# Patient Record
Sex: Female | Born: 1935 | Race: Black or African American | Hispanic: No | Smoking: Never smoker
Health system: Southern US, Community
[De-identification: ages and names within clinical notes are randomized; demographics above are authoritative.]

## PROBLEM LIST (undated history)

## (undated) DIAGNOSIS — W19XXXA Unspecified fall, initial encounter: Secondary | ICD-10-CM

## (undated) DIAGNOSIS — G309 Alzheimer's disease, unspecified: Secondary | ICD-10-CM

## (undated) DIAGNOSIS — N189 Chronic kidney disease, unspecified: Secondary | ICD-10-CM

## (undated) DIAGNOSIS — F0390 Unspecified dementia without behavioral disturbance: Secondary | ICD-10-CM

## (undated) DIAGNOSIS — F0280 Dementia in other diseases classified elsewhere without behavioral disturbance: Secondary | ICD-10-CM

## (undated) HISTORY — PX: ABDOMINAL HYSTERECTOMY: SHX81

---

## 2012-07-18 ENCOUNTER — Ambulatory Visit (INDEPENDENT_AMBULATORY_CARE_PROVIDER_SITE_OTHER): Payer: Medicare Other | Admitting: Family Medicine

## 2012-07-18 VITALS — BP 160/68 | HR 74 | Temp 98.3°F | Resp 16 | Ht 67.75 in | Wt 112.0 lb

## 2012-07-18 DIAGNOSIS — IMO0001 Reserved for inherently not codable concepts without codable children: Secondary | ICD-10-CM

## 2012-07-18 DIAGNOSIS — R609 Edema, unspecified: Secondary | ICD-10-CM

## 2012-07-18 DIAGNOSIS — L0291 Cutaneous abscess, unspecified: Secondary | ICD-10-CM

## 2012-07-18 DIAGNOSIS — L0390 Cellulitis, unspecified: Secondary | ICD-10-CM

## 2012-07-18 LAB — POCT CBC
HCT, POC: 39.2 % (ref 37.7–47.9)
Hemoglobin: 12.4 g/dL (ref 12.2–16.2)
Lymph, poc: 1.9 (ref 0.6–3.4)
MCH, POC: 27 pg (ref 27–31.2)
MPV: 8.1 fL (ref 0–99.8)
POC Granulocyte: 7.6 — AB (ref 2–6.9)
POC MID %: 7.3 %M (ref 0–12)
RBC: 4.6 M/uL (ref 4.04–5.48)
WBC: 10.2 10*3/uL (ref 4.6–10.2)

## 2012-07-18 LAB — GLUCOSE, POCT (MANUAL RESULT ENTRY): POC Glucose: 87 mg/dl (ref 70–99)

## 2012-07-18 MED ORDER — CEPHALEXIN 500 MG PO CAPS: 500.0000 mg | ORAL_CAPSULE | Freq: Four times a day (QID) | ORAL | Status: DC

## 2012-07-18 NOTE — Progress Notes (Signed)
Subjective:    Patient ID: Shelley Fowler, female    DOB: [birth year], 79 y.o.   MRN: [id number]   Chief Complaint  Patient presents with  . Leg Swelling    left lower and foot    HPI  Normally seen at [facility] - last seen in 02/2012.  Last labs were 02/2011 - nml glucose.    Shelley Fowler was in her normal state of health until yesterday morning when her family noted that her left lower leg looked more swollen than usual. In the afternoon, the swelling looked a little more prominent and there were red spots on the back of her lower leg above her ankle.  Today the swelling seems a little better but the whole lower leg turned hot and red.  No cough, SHoB, CP, palpitations.  2d ago got very DOE when walking down the driveway which is new.  Baseline dementia - cared for by family. Here with daughter and granddaughter.  History reviewed. No pertinent past medical history. No current outpatient prescriptions on file prior to visit.   No current facility-administered medications on file prior to visit.   No Known Allergies   Review of Systems  Constitutional: Positive for activity change. Negative for fever, chills and diaphoresis.  Respiratory: Positive for shortness of breath. Negative for cough, chest tightness and wheezing.   Cardiovascular: Positive for leg swelling. Negative for chest pain and palpitations.  Musculoskeletal: Positive for myalgias and joint swelling. Negative for gait problem.  Skin: Positive for color change and rash. Negative for pallor and wound.  Hematological: Negative for adenopathy.  Psychiatric/Behavioral: Positive for confusion.      BP 160/68  Pulse 74  Temp(Src) 98.3 F (36.8 C) (Oral)  Resp 16  Ht 5' 7.75" (1.721 m)  Wt 112 lb (50.803 kg)  BMI 17.15 kg/m2  SpO2 97% Objective:   Physical Exam  Constitutional: She is oriented to person, place, and time. She appears well-developed and well-nourished. No distress.  HENT:  Head:  Normocephalic and atraumatic.  Right Ear: External ear normal.  Eyes: Conjunctivae are normal. No scleral icterus.  Pulmonary/Chest: Effort normal.  Neurological: She is alert and oriented to person, place, and time.  Skin: Skin is warm and dry. Rash noted. Rash is macular. She is not diaphoretic. No erythema.     Red non-blanching warm continuous macule over left lower leg above ankle to mid-calf, mildly tender, 2+ pitting edema. Neg homan's sign. No calf tenderness, no palpable cords.  Psychiatric: She has a normal mood and affect. Her behavior is normal.      Results for orders placed in visit on 07/18/12  POCT CBC      Result Value Range   WBC 10.2  4.6 - 10.2 K/uL   Lymph, poc 1.9  0.6 - 3.4   POC LYMPH PERCENT 18.2  10 - 50 %L   MID (cbc) 0.7  0 - 0.9   POC MID % 7.3  0 - 12 %M   POC Granulocyte 7.6 (*) 2 - 6.9   Granulocyte percent 74.5  37 - 80 %G   RBC 4.60  4.04 - 5.48 M/uL   Hemoglobin 12.4  12.2 - 16.2 g/dL   HCT, POC 40.9  81.1 - 47.9 %   MCV 85.2  80 - 97 fL   MCH, POC 27.0  27 - 31.2 pg   MCHC 31.6 (*) 31.8 - 35.4 g/dL   RDW, POC 91.4     Platelet Count,  POC 283  142 - 424 K/uL   MPV 8.1  0 - 99.8 fL  GLUCOSE, POCT (MANUAL RESULT ENTRY)      Result Value Range   POC Glucose 87  70 - 99 mg/dl    Assessment & Plan:  Edema - Plan: POCT CBC  Cellulitis - Plan: POCT glucose (manual entry) - outlined w/ sure script marker. RTC immed if worsening. If swelling continues or progresses, RTC immed as may need lower ext [place],  Myalgia and myositis  - Plan: POCT glucose (manual entry)  Meds ordered this encounter  Medications  . cephALEXin (KEFLEX) 500 MG capsule    Sig: Take 1 capsule (500 mg total) by mouth 4 (four) times daily.    Dispense:  28 capsule    Refill:  0

## 2012-07-18 NOTE — Patient Instructions (Addendum)
Cellulitis Cellulitis is an infection of the skin and the tissue beneath it. The infected area is usually red and tender. Cellulitis occurs most often in the arms and lower legs.  CAUSES  Cellulitis is caused by bacteria that enter the skin through cracks or cuts in the skin. The most common types of bacteria that cause cellulitis are Staphylococcus and Streptococcus. SYMPTOMS   Redness and warmth.  Swelling.  Tenderness or pain.  Fever. DIAGNOSIS  Your caregiver can usually determine what is wrong based on a physical exam. Blood tests may also be done. TREATMENT  Treatment usually involves taking an antibiotic medicine. HOME CARE INSTRUCTIONS   Take your antibiotics as directed. Finish them even if you start to feel better.  Keep the infected arm or leg elevated to reduce swelling.  Apply a warm cloth to the affected area up to 4 times per day to relieve pain.  Only take over-the-counter or prescription medicines for pain, discomfort, or fever as directed by your caregiver.  Keep all follow-up appointments as directed by your caregiver. SEEK MEDICAL CARE IF:   You notice red streaks coming from the infected area.  Your red area gets larger or turns dark in color.  Your bone or joint underneath the infected area becomes painful after the skin has healed.  Your infection returns in the same area or another area.  You notice a swollen bump in the infected area.  You develop new symptoms. SEEK IMMEDIATE MEDICAL CARE IF:   You have a fever.  You feel very sleepy.  You develop vomiting or diarrhea.  You have a general ill feeling (malaise) with muscle aches and pains. MAKE SURE YOU:   Understand these instructions.  Will watch your condition.  Will get help right away if you are not doing well or get worse. Document Released: 10/02/2004 Document Revised: 06/24/2011 Document Reviewed: 03/10/2011 Exit[employer] Patient Information 2014 [employer].  

## 2012-07-22 ENCOUNTER — Inpatient Hospital Stay (HOSPITAL_COMMUNITY)
Admission: EM | Admit: 2012-07-22 | Discharge: 2012-07-24 | DRG: 603 | Disposition: A | Discharge status: Home / Self Care | Payer: Medicare Other | Attending: Internal Medicine | Admitting: Internal Medicine

## 2012-07-22 ENCOUNTER — Encounter (HOSPITAL_COMMUNITY): Payer: Self-pay | Admitting: Emergency Medicine

## 2012-07-22 ENCOUNTER — Other Ambulatory Visit: Payer: Self-pay

## 2012-07-22 DIAGNOSIS — L02419 Cutaneous abscess of limb, unspecified: Principal | ICD-10-CM | POA: Diagnosis present

## 2012-07-22 DIAGNOSIS — Z87891 Personal history of nicotine dependence: Secondary | ICD-10-CM

## 2012-07-22 DIAGNOSIS — L03119 Cellulitis of unspecified part of limb: Principal | ICD-10-CM | POA: Diagnosis present

## 2012-07-22 DIAGNOSIS — G309 Alzheimer's disease, unspecified: Secondary | ICD-10-CM | POA: Diagnosis present

## 2012-07-22 DIAGNOSIS — F0280 Dementia in other diseases classified elsewhere without behavioral disturbance: Secondary | ICD-10-CM | POA: Diagnosis present

## 2012-07-22 DIAGNOSIS — L03116 Cellulitis of left lower limb: Secondary | ICD-10-CM

## 2012-07-22 DIAGNOSIS — M7989 Other specified soft tissue disorders: Secondary | ICD-10-CM

## 2012-07-22 DIAGNOSIS — Z66 Do not resuscitate: Secondary | ICD-10-CM | POA: Diagnosis present

## 2012-07-22 HISTORY — DX: Unspecified dementia, unspecified severity, without behavioral disturbance, psychotic disturbance, mood disturbance, and anxiety: F03.90

## 2012-07-22 LAB — CBC WITH DIFFERENTIAL/PLATELET
Basophils Absolute: 0 10*3/uL (ref 0.0–0.1)
Eosinophils Relative: 1 % (ref 0–5)
HCT: 35.9 % — ABNORMAL LOW (ref 36.0–46.0)
Hemoglobin: 12.3 g/dL (ref 12.0–15.0)
Lymphocytes Relative: 22 % (ref 12–46)
Lymphs Abs: 1.6 10*3/uL (ref 0.7–4.0)
MCV: 78.6 fL (ref 78.0–100.0)
Monocytes Absolute: 0.6 10*3/uL (ref 0.1–1.0)
Monocytes Relative: 8 % (ref 3–12)
Neutro Abs: 5.2 10*3/uL (ref 1.7–7.7)
WBC: 7.6 10*3/uL (ref 4.0–10.5)

## 2012-07-22 LAB — BASIC METABOLIC PANEL
CO2: 28 mEq/L (ref 19–32)
Chloride: 106 mEq/L (ref 96–112)
GFR calc Af Amer: 66 mL/min — ABNORMAL LOW (ref >90)
Potassium: 3.6 mEq/L (ref 3.5–5.1)

## 2012-07-22 LAB — VITAMIN B12: Vitamin B-12: 920 pg/mL — ABNORMAL HIGH (ref 211–911)

## 2012-07-22 MED ORDER — HEPARIN SODIUM (PORCINE) 5000 UNIT/ML IJ SOLN
5000.0000 [IU] | Freq: Three times a day (TID) | INTRAMUSCULAR | Status: DC
  Administered 2012-07-22 – 2012-07-24 (×6): 5000 [IU] via SUBCUTANEOUS
  Filled 2012-07-22 (×9): qty 1

## 2012-07-22 MED ORDER — ACETAMINOPHEN 325 MG PO TABS: 650.0000 mg | ORAL_TABLET | Freq: Four times a day (QID) | ORAL | Status: DC | PRN

## 2012-07-22 MED ORDER — ONDANSETRON HCL 4 MG PO TABS: 4.0000 mg | ORAL_TABLET | Freq: Four times a day (QID) | ORAL | Status: DC | PRN

## 2012-07-22 MED ORDER — ACETAMINOPHEN 650 MG RE SUPP: 650.0000 mg | Freq: Four times a day (QID) | RECTAL | Status: DC | PRN

## 2012-07-22 MED ORDER — HALOPERIDOL LACTATE 5 MG/ML IJ SOLN
0.5000 mg | Freq: Three times a day (TID) | INTRAMUSCULAR | Status: DC | PRN
  Administered 2012-07-22: 0.5 mg via INTRAVENOUS
  Filled 2012-07-22 (×2): qty 1

## 2012-07-22 MED ORDER — SODIUM CHLORIDE 0.9 % IV SOLN
Freq: Once | INTRAVENOUS | Status: AC
  Administered 2012-07-22: 13:00:00 via INTRAVENOUS

## 2012-07-22 MED ORDER — SODIUM CHLORIDE 0.9 % IV SOLN: INTRAVENOUS | Status: DC

## 2012-07-22 MED ORDER — SODIUM CHLORIDE 0.9 % IJ SOLN: 3.0000 mL | INTRAMUSCULAR | Status: DC | PRN

## 2012-07-22 MED ORDER — SODIUM CHLORIDE 0.9 % IJ SOLN
3.0000 mL | Freq: Two times a day (BID) | INTRAMUSCULAR | Status: DC
  Administered 2012-07-22 – 2012-07-24 (×2): 3 mL via INTRAVENOUS

## 2012-07-22 MED ORDER — SODIUM CHLORIDE 0.9 % IV SOLN: 250.0000 mL | INTRAVENOUS | Status: DC | PRN

## 2012-07-22 MED ORDER — SODIUM CHLORIDE 0.9 % IV SOLN
INTRAVENOUS | Status: DC
  Administered 2012-07-22: 15:00:00 via INTRAVENOUS

## 2012-07-22 MED ORDER — VANCOMYCIN HCL 500 MG IV SOLR
500.0000 mg | INTRAVENOUS | Status: DC
  Administered 2012-07-23: 500 mg via INTRAVENOUS
  Filled 2012-07-22 (×3): qty 500

## 2012-07-22 MED ORDER — PIPERACILLIN-TAZOBACTAM 3.375 G IVPB
3.3750 g | Freq: Three times a day (TID) | INTRAVENOUS | Status: DC
  Administered 2012-07-22 – 2012-07-24 (×6): 3.375 g via INTRAVENOUS
  Filled 2012-07-22 (×8): qty 50

## 2012-07-22 MED ORDER — ONDANSETRON HCL 4 MG/2ML IJ SOLN: 4.0000 mg | Freq: Four times a day (QID) | INTRAMUSCULAR | Status: DC | PRN

## 2012-07-22 MED ORDER — VANCOMYCIN HCL IN DEXTROSE 1-5 GM/200ML-% IV SOLN
1000.0000 mg | INTRAVENOUS | Status: DC
  Filled 2012-07-22: qty 200

## 2012-07-22 MED ORDER — VANCOMYCIN HCL 500 MG IV SOLR
500.0000 mg | INTRAVENOUS | Status: AC
  Administered 2012-07-22: 500 mg via INTRAVENOUS
  Filled 2012-07-22 (×2): qty 500

## 2012-07-22 MED ORDER — MORPHINE SULFATE 2 MG/ML IJ SOLN: 2.0000 mg | INTRAMUSCULAR | Status: DC | PRN

## 2012-07-22 MED ORDER — OXYCODONE HCL 5 MG PO TABS: 5.0000 mg | ORAL_TABLET | ORAL | Status: DC | PRN

## 2012-07-22 NOTE — Progress Notes (Signed)
ANTIBIOTIC CONSULT NOTE - INITIAL  Pharmacy Consult for Zosyn/vancomycin Indication: Cellulitis   No Known Allergies  Patient Measurements:     Vital Signs: Temp: 99.3 F (37.4 C) (07/17 1144) Temp src: Oral (07/17 1144) BP: 130/65 mmHg (07/17 1144) Pulse Rate: 62 (07/17 1144) Intake/Output from previous day:   Intake/Output from this shift:    Labs:  Recent Labs  07/22/12 1145  WBC 7.6  HGB 12.3  PLT 347  CREATININE 0.90   The CrCl is unknown because both a height and weight (above a minimum accepted value) are required for this calculation. No results found for this basename: VANCOTROUGH, VANCOPEAK, VANCORANDOM, GENTTROUGH, GENTPEAK, GENTRANDOM, TOBRATROUGH, TOBRAPEAK, TOBRARND, AMIKACINPEAK, AMIKACINTROU, AMIKACIN,  in the last 72 hours   Microbiology: No results found for this or any previous visit (from the past 720 hour(s)).  Medical History: Past Medical History  Diagnosis Date  . Dementia     Medications:  Prescriptions prior to admission  Medication Sig Dispense Refill  . cephALEXin (KEFLEX) 500 MG capsule Take 1 capsule (500 mg total) by mouth 4 (four) times daily.  28 capsule  0   Scheduled:  . heparin  5,000 Units Subcutaneous Q8H  . piperacillin-tazobactam (ZOSYN)  IV  3.375 g Intravenous Q8H  . sodium chloride  3 mL Intravenous Q12H  . [START ON 07/23/2012] vancomycin  500 mg Intravenous Q24H   Infusions:  . sodium chloride     PRN: sodium chloride, acetaminophen, acetaminophen, morphine injection, ondansetron (ZOFRAN) IV, ondansetron, oxyCODONE, sodium chloride Anti-infectives   Start     Dose/Rate Route Frequency Ordered Stop   07/23/12 1400  vancomycin (VANCOCIN) 500 mg in sodium chloride 0.9 % 100 mL IVPB     500 mg 100 mL/hr over 60 Minutes Intravenous Every 24 hours 07/22/12 1312     07/22/12 1600  piperacillin-tazobactam (ZOSYN) IVPB 3.375 g     3.375 g 12.5 mL/hr over 240 Minutes Intravenous Every 8 hours 07/22/12 1515     07/22/12 1315  vancomycin (VANCOCIN) 500 mg in sodium chloride 0.9 % 100 mL IVPB     500 mg 100 mL/hr over 60 Minutes Intravenous To Emergency Dept 07/22/12 1311 07/22/12 1421   07/22/12 1300  vancomycin (VANCOCIN) IVPB 1000 mg/200 mL premix  Status:  Discontinued     1,000 mg 200 mL/hr over 60 Minutes Intravenous To Emergency Dept 07/22/12 1250 07/22/12 1311     Assessment:  79 yo F with progressive cellulitis of L leg since 07/17/12, despite use of keflex PTA since 07/18/12.    Pharmacy previously started Vancomycin, Will add Zosyn now.    Patients renal function is WNL   Goal of Therapy:  Zosyn per renal function   Plan:  1.) Zosyn 3.375 grams IV q8h 2.) Continue vancomycin as previous ordered 3.) Continue to monitor renal function, f/u cultures  Atiana Levier, Loma Messing PharmD Pager #: [phone] 3:39 PM 07/22/2012

## 2012-07-22 NOTE — H&P (Signed)
PCP:   No PCP Per Patient   Chief Complaint:  Swelling, redness and pain, left leg.   HPI: This is an 79 year old female, with known history of Dementia, s/p remote TAH, presenting with progressive swelling and redness of LLE. According to her daughter, who was at the bedside, and provided the history, patient was in her normal state of health until morning of 07/17/12, when her family noted that her left lower leg looked more swollen than usual. In the afternoon of the same day, the swelling looked a little more prominent and there were red spots on the back of her lower leg above her ankle. No antecedent trauma, no fever, although she did complain of feeling "cold". On 07/18/12, the swelling seemed a little better but the whole lower leg turned hot and red. Patient was seen at [facility] on 07/18/12., where she was evaluated by a Dr Clelia Croft, prescribed Keflex 4 x daily, and recommended to elevate the affected keg. Patient was compliant with medication, but symptoms have progressed, the left lower leg appeared darker and cold on 07/20/12, and by evening of 07/21/12, the redness had reached up to the upper 2/3 of her left leg. In AM of 07/22/12, patient's daughter took her to see her PMD, Dr Daphine Deutscher at [facility], and he recommended ED visit.    Allergies:  No Known Allergies    Past Medical History  Diagnosis Date  . Dementia     Past Surgical History  Procedure Laterality Date  . Abdominal hysterectomy      Prior to Admission medications   Medication Sig Start Date End Date Taking? Authorizing Provider  cephALEXin (KEFLEX) 500 MG capsule Take 1 capsule (500 mg total) by mouth 4 (four) times daily. 07/18/12  Yes Sherren Mocha, MD    Social History: Patient is an ex-smoker. She smoked just less than a pack of cigarettes per day, but quit about 3 years ago. She has never used smokeless tobacco. She reports that she does not drink alcohol or use illicit drugs.  Family History: Patient's father  died at age 8  Years, from colon cancer. Her mother died of old age. She was hypertensive.   Review of Systems:  As per HPI and chief complaint. Patent denies fatigue, and according to her daughter, appetite is OK, although she does not eat much, and has lost some weight over the years. No fever, headache, blurred vision, difficulty in speaking, dysphagia, chest pain, cough, shortness of breath, orthopnea, paroxysmal nocturnal dyspnea, nausea, diaphoresis, abdominal pain, vomiting, diarrhea, hematemesis, melena. The rest of the systems review is negative.  Physical Exam:  General:  Patient does not appear to be in obvious acute distress. Sleeping, but easily rousable, and follows simple commands. Communicative, talking in complete sentences, not short of breath at rest.  HEENT:  No clinical pallor, no jaundice, no conjunctival injection or discharge. Hydration is satisfactory.  NECK:  Supple, JVP not seen, no carotid bruits, no palpable lymphadenopathy, no palpable goiter. CHEST:  Clinically clear to auscultation, no wheezes, no crackles. HEART:  Sounds 1 and 2 heard, normal, regular, no murmurs. ABDOMEN:  Full, soft, non-tender, no palpable organomegaly, no palpable masses, normal bowel sounds. GENITALIA:  Not examined. LOWER EXTREMITIES:  RLE mild pitting edema, palpable peripheral pulses. LLE swelling, redness and increased local temperature, from just above left ankle, to just below left knee. Palpable pulses.  MUSCULOSKELETAL SYSTEM:  Generalized osteoarthritic changes, otherwise, normal. CENTRAL NERVOUS SYSTEM:  No focal neurologic deficit  on gross examination.  Labs on Admission:  Results for orders placed during the hospital encounter of 07/22/12 (from the past 48 hour(s))  BASIC METABOLIC PANEL     Status: Abnormal   Collection Time    07/22/12 11:45 AM      Result Value Range   Sodium 142  135 - 145 mEq/L   Potassium 3.6  3.5 - 5.1 mEq/L   Chloride 106  96 - 112 mEq/L   CO2 28   19 - 32 mEq/L   Glucose, Bld 72  70 - 99 mg/dL   BUN 14  6 - 23 mg/dL   Creatinine, Ser 3.08  0.50 - 1.10 mg/dL   Calcium 9.5  8.4 - 65.7 mg/dL   GFR calc non Af Amer 57 (*) >90 mL/min   GFR calc Af Amer 66 (*) >90 mL/min   Comment:            The eGFR has been calculated     using the CKD EPI equation.     This calculation has not been     validated in all clinical     situations.     eGFR's persistently     <90 mL/min signify     possible Chronic Kidney Disease.  CBC WITH DIFFERENTIAL     Status: Abnormal   Collection Time    07/22/12 11:45 AM      Result Value Range   WBC 7.6  4.0 - 10.5 K/uL   RBC 4.57  3.87 - 5.11 MIL/uL   Hemoglobin 12.3  12.0 - 15.0 g/dL   HCT 84.6 (*) 96.2 - 95.2 %   MCV 78.6  78.0 - 100.0 fL   MCH 26.9  26.0 - 34.0 pg   MCHC 34.3  30.0 - 36.0 g/dL   RDW 84.1  32.4 - 40.1 %   Platelets 347  150 - 400 K/uL   Neutrophils Relative % 69  43 - 77 %   Neutro Abs 5.2  1.7 - 7.7 K/uL   Lymphocytes Relative 22  12 - 46 %   Lymphs Abs 1.6  0.7 - 4.0 K/uL   Monocytes Relative 8  3 - 12 %   Monocytes Absolute 0.6  0.1 - 1.0 K/uL   Eosinophils Relative 1  0 - 5 %   Eosinophils Absolute 0.1  0.0 - 0.7 K/uL   Basophils Relative 1  0 - 1 %   Basophils Absolute 0.0  0.0 - 0.1 K/uL    Radiological Exams on Admission: No results found.  Assessment/Plan Active Problems:   1. Cellulitis of leg, left: Patient has had progressive LLE swelling, pain redness and increased local temperature, since 07/17/12. Appearance of affected leg has worsened, despite utilization of Keflex since 07/18/12. As she has failed outpatient treatment, will admit for iv antibiotics. Wee shall manage with iv Vancomycin/Zosyn, elevated leg, send off blood cultures and utilize prn analgesics. Patient does not appear toxic however, wcc is normal, and she is afebrile. We shall check LLE venous doppler to evaluate for DVT.  2. Alzheimer's disease: Dementia is advanced, and patient is therefore,  not on specific medication. Will check TSH, B12/Folate, for completeness.   Further management will depend on clinical course.  Comment: have discussed Code Status with daughter at bedside, and she confirmed that patient is DNR/DNI.    Time Spent on Admission: 45 mins.   Aristide Waggle,CHRISTOPHER 07/22/2012, 2:52 PM

## 2012-07-22 NOTE — ED Notes (Signed)
MD at bedside. 

## 2012-07-22 NOTE — Progress Notes (Addendum)
ANTIBIOTIC CONSULT NOTE - INITIAL  Pharmacy Consult for vancomycin Indication: cellulitis  No Known Allergies  Patient Measurements:   07/18/12 office visit: 50.8kg, 68in  Vital Signs: Temp: 99.3 F (37.4 C) (07/17 1144) Temp src: Oral (07/17 1144) BP: 130/65 mmHg (07/17 1144) Pulse Rate: 62 (07/17 1144) Intake/Output from previous day:   Intake/Output from this shift:    Labs:  Recent Labs  07/22/12 1145  WBC 7.6  HGB 12.3  PLT 347   CrCl is unknown because no creatinine reading has been taken. No results found for this basename: VANCOTROUGH, VANCOPEAK, VANCORANDOM, GENTTROUGH, GENTPEAK, GENTRANDOM, TOBRATROUGH, TOBRAPEAK, TOBRARND, AMIKACINPEAK, AMIKACINTROU, AMIKACIN,  in the last 72 hours   Microbiology: No results found for this or any previous visit (from the past 720 hour(s)).  Medical History: Past Medical History  Diagnosis Date  . Dementia     Medications:  Scheduled:  Infusions:   Assessment: 79 yo female presents to ER 7/17 with worsening cellulitis since seeing urgent care on 7/13 and prescribed Keflex. Also noted that patient is now experiencing SOB. To start vancomycin per pharmacy. Patient afebrile and WBC WNL per 7/17 labs.   Scr today 7/17 = 0.9, estimated CrCl is therefore 37 ml/min  Goal of Therapy:  Vancomycin trough level 10-15 mcg/ml  Plan:  1) Based on current CrCl, start vancomycin 500mg  IV q24 2) Vanc trough at steady state  Hessie Knows, PharmD, BCPS Pager [phone] 07/22/2012 12:49 PM

## 2012-07-22 NOTE — ED Provider Notes (Signed)
   History    CSN: [id number] Arrival date & time 07/22/12  1131  First MD Initiated Contact with Patient 07/22/12 1138     Chief Complaint  Patient presents with  . Shortness of Breath  . Leg Swelling   (Consider location/radiation/quality/duration/timing/severity/associated sxs/prior Treatment) HPI.... level V caveat for dementia.   Chief complaint left leg redness and swelling.   Family reports swelling and redness of mothers left lower extremity.  She was examined over the weekend in an urgent care Center and prescribed Keflex.  This has not helped.  She is more lethargic. Decreased intake.  No obvious fever or chills. Past Medical History  Diagnosis Date  . Dementia    Past Surgical History  Procedure Laterality Date  . Abdominal hysterectomy     No family history on file. History  Substance Use Topics  . Smoking status: Never Smoker   . Smokeless tobacco: Never Used  . Alcohol Use: No   OB History   Grav Para Term Preterm Abortions TAB SAB Ect Mult Living                 Review of Systems  All other systems reviewed and are negative.    Allergies  Review of patient's allergies indicates no known allergies.  Home Medications   Current Outpatient Rx  Name  Route  Sig  Dispense  Refill  . cephALEXin (KEFLEX) 500 MG capsule   Oral   Take 1 capsule (500 mg total) by mouth 4 (four) times daily.   28 capsule   0    BP 130/65  Pulse 62  Temp(Src) 99.3 F (37.4 C) (Oral)  Resp 20  SpO2 100% Physical Exam  Nursing note and vitals reviewed. Constitutional:  Demented  HENT:  Head: Normocephalic and atraumatic.  Eyes: Conjunctivae and EOM are normal. Pupils are equal, round, and reactive to light.  Neck: Normal range of motion. Neck supple.  Cardiovascular: Normal rate, regular rhythm and normal heart sounds.   Pulmonary/Chest: Effort normal and breath sounds normal.  Abdominal: Soft. Bowel sounds are normal.  Musculoskeletal: Normal range of motion.   Neurological:  Able to move all her extremities  Skin:  Left lower extremity:   Area of erythema and edema circumferentially from the tibia to dorsum of foot  Psychiatric:  Demented    ED Course  Procedures (including critical care time) Labs Reviewed  CBC WITH DIFFERENTIAL - Abnormal; Notable for the following:    HCT 35.9 (*)    All other components within normal limits  BASIC METABOLIC PANEL   No results found. No diagnosis found.    Date: 07/22/2012  Rate: 61  Rhythm: normal sinus rhythm  QRS Axis: normal  Intervals: normal  ST/T Wave abnormalities: normal  Conduction Disutrbances: none  Narrative Interpretation: unremarkable    MDM  Elderly demented patient with cellulitis of left lower extremity.   Rx IV vancomycin. Admit to general medicine  Donnetta Hutching, MD 07/22/12 1351

## 2012-07-22 NOTE — Progress Notes (Signed)
VASCULAR LAB PRELIMINARY  PRELIMINARY  PRELIMINARY  PRELIMINARY  Left lower extremity venous duplex completed.    Preliminary report:  Left:  No evidence of DVT, superficial thrombosis, or Baker's cyst.  Abas Leicht, RVS 07/22/2012, 4:02 PM

## 2012-07-22 NOTE — ED Notes (Signed)
Pt brought in by family. Pt has dementia and is unable to provide history. Family states that the patient has experienced shortness of breath for the past two days. Pt has had left sided leg swelling that began on Saturday. Pt was taken to Urgent Care on Sunday and was told she had Cellulitis. Family reports that the swelling has progressed upward and has began in the right leg.

## 2012-07-22 NOTE — Progress Notes (Signed)
Pt confused H/O dementia pt pulling at IV lines and trying to get out of bed. Notified MD, Notified Charge nurse that pt needed Recruitment consultant. Soft mitt restraints applied pt pulled off. Pt with all four side rails up on bed requested by daughter. Pt with bed alarm on. Encouraged pt to call for assistance and use call bell. Pt left with call bell in reach and side rails up per request of daughter.

## 2012-07-23 ENCOUNTER — Encounter (HOSPITAL_COMMUNITY): Payer: Self-pay | Admitting: *Deleted

## 2012-07-23 LAB — CBC
HCT: 33 % — ABNORMAL LOW (ref 36.0–46.0)
Hemoglobin: 11.2 g/dL — ABNORMAL LOW (ref 12.0–15.0)
MCHC: 33.9 g/dL (ref 30.0–36.0)
RBC: 4.18 MIL/uL (ref 3.87–5.11)
WBC: 7.3 10*3/uL (ref 4.0–10.5)

## 2012-07-23 LAB — COMPREHENSIVE METABOLIC PANEL
ALT: 10 U/L (ref 0–35)
Calcium: 9 mg/dL (ref 8.4–10.5)
Creatinine, Ser: 0.99 mg/dL (ref 0.50–1.10)
GFR calc Af Amer: 59 mL/min — ABNORMAL LOW (ref >90)
GFR calc non Af Amer: 51 mL/min — ABNORMAL LOW (ref >90)
Glucose, Bld: 113 mg/dL — ABNORMAL HIGH (ref 70–99)
Sodium: 141 mEq/L (ref 135–145)
Total Protein: 6.3 g/dL (ref 6.0–8.3)

## 2012-07-23 LAB — FOLATE RBC: RBC Folate: 776 ng/mL — ABNORMAL HIGH (ref >=366)

## 2012-07-23 MED ORDER — SULFAMETHOXAZOLE-TMP DS 800-160 MG PO TABS: 1.0000 | ORAL_TABLET | Freq: Two times a day (BID) | ORAL | Status: DC

## 2012-07-23 MED ORDER — PNEUMOCOCCAL VAC POLYVALENT 25 MCG/0.5ML IJ INJ
0.5000 mL | INJECTION | INTRAMUSCULAR | Status: AC
  Administered 2012-07-24: 0.5 mL via INTRAMUSCULAR
  Filled 2012-07-23 (×2): qty 0.5

## 2012-07-23 NOTE — Progress Notes (Addendum)
Patient ID: Shelley Fowler, female   DOB: 1925-07-21, 79 y.o.   MRN: [id number] [facility] PROGRESS NOTE  MYCAH MCDOUGALL WUJ:[id number] DOB: [birth year] DOA: 07/22/2012 PCP: No PCP Per Patient  Brief narrative: 79 year old female, with known history of dementia, s/p remote TAH, presenting with progressive swelling and redness of LLE. According to her daughter, who was at the bedside, and provided the history, patient was in her normal state of health until morning of 07/17/12, when her family noted that her left lower extremity appeared to be swollen, red, tender and warm to touch, associated with subjective fevers and chills. Patient was seen at [facility] on 07/18/12., where she was evaluated by a Dr Clelia Croft, prescribed Keflex 4 x daily, and recommended to elevate the affected keg. Patient was compliant with medication, but symptoms have progressed, the left lower leg appeared darker and cold on 07/20/12, and by evening of 07/21/12, the redness had reached up to the upper 2/3 of her left leg.  Active Problems:   Cellulitis of leg, left - clinically improving and less tender to palpation - continue Vancomycin and Zosyn day #2 - plan on transitioning to oral ABX in am - keep left extremity elevated    Alzheimer's disease - PT evaluation  Consultants:  None  Procedures/Studies: 07/22/2012 - Left lower extremity venous duplex. Preliminary report: Left: No evidence of DVT, superficial thrombosis, or Baker's cyst.  Antibiotics:  Vancomycin 7/17 -->  Zosyn 7/17 -->  Code Status: DNR Family Communication: Pt at bedside Disposition Plan: Home when medically stable  HPI/Subjective: No events overnight.   Objective: Filed Vitals:   07/22/12 1530 07/22/12 2156 07/23/12 0655 07/23/12 1430  BP: 163/64 140/74 125/64 106/91  Pulse: 73 62 56 76  Temp: 98.8 F (37.1 C) 97.2 F (36.2 C) 97.4 F (36.3 C) 97.7 F (36.5 C)  TempSrc: Oral Axillary Axillary   Resp: 18 16 16 16   Height: 5' 7.5" (1.715  m)     Weight: 51.71 kg (114 lb)     SpO2: 100% 100% 100% 100%    Intake/Output Summary (Last 24 hours) at 07/23/12 1640 Last data filed at 07/23/12 1230  Gross per 24 hour  Intake   2890 ml  Output   1100 ml  Net   1790 ml    Exam:   General:  Pt is alert, follows commands appropriately, not in acute distress  Cardiovascular: Regular rate and rhythm, S1/S2, no murmurs, no rubs, no gallops  Respiratory: Clear to auscultation bilaterally, no wheezing, no crackles, no rhonchi  Abdomen: Soft, non tender, non distended, bowel sounds present, no guarding  Extremities: left lower extremity edema and erythema extending to mid shin area, no tenderness of palpation   Neuro: Grossly nonfocal  Data Reviewed: Basic Metabolic Panel:  Recent Labs Lab 07/22/12 1145 07/23/12 0414  NA 142 141  K 3.6 3.6  CL 106 106  CO2 28 30  GLUCOSE 72 113*  BUN 14 13  CREATININE 0.90 0.99  CALCIUM 9.5 9.0   Liver Function Tests:  Recent Labs Lab 07/23/12 0414  AST 20  ALT 10  ALKPHOS 47  BILITOT 0.6  PROT 6.3  ALBUMIN 2.8*   CBC:  Recent Labs Lab 07/22/12 1145 07/23/12 0414  WBC 7.6 7.3  NEUTROABS 5.2  --   HGB 12.3 11.2*  HCT 35.9* 33.0*  MCV 78.6 78.9  PLT 347 314   Recent Results (from the past 240 hour(s))  CULTURE, BLOOD (ROUTINE X 2)  Status: None   Collection Time    07/22/12  4:30 PM      Result Value Range Status   Specimen Description BLOOD RIGHT ARM   Final   Special Requests BOTTLES DRAWN AEROBIC AND ANAEROBIC   Final   Culture  Setup Time 07/22/2012 22:48   Final   Culture     Final   Value:        BLOOD CULTURE RECEIVED NO GROWTH TO DATE CULTURE WILL BE HELD FOR 5 DAYS BEFORE ISSUING A FINAL NEGATIVE REPORT   Report Status PENDING   Incomplete  CULTURE, BLOOD (ROUTINE X 2)     Status: None   Collection Time    07/22/12  4:35 PM      Result Value Range Status   Specimen Description BLOOD RIGHT HAND   Final   Special Requests BOTTLES DRAWN  AEROBIC AND ANAEROBIC   Final   Culture  Setup Time 07/22/2012 22:48   Final   Culture     Final   Value:        BLOOD CULTURE RECEIVED NO GROWTH TO DATE CULTURE WILL BE HELD FOR 5 DAYS BEFORE ISSUING A FINAL NEGATIVE REPORT   Report Status PENDING   Incomplete    Scheduled Meds: . heparin  5,000 Units Subcutaneous Q8H  . ZOSYN IV  3.375 g Intravenous Q8H  . vancomycin  500 mg Intravenous Q24H   Continuous Infusions: . sodium chloride 50 mL/hr at 07/22/12 1539   Debbora Presto, MD  [facility] Pager [phone]  If 7PM-7AM, please contact night-coverage [email or web address] Password [email or web address] 07/23/2012, 4:40 PM   LOS: 1 day

## 2012-07-23 NOTE — Care Management Note (Addendum)
CARE MANAGEMENT NOTE 07/23/2012  Patient:  Shelley Fowler, Shelley Fowler   Account Number:  [id number]  Date Initiated:  07/23/2012  Documentation initiated by:  Tiburcio Linder  Subjective/Objective Assessment:   79 yo female admitted with cellulitis. PTA pt from home with adult daughter assisting with care. PCP: Dr Daphine Deutscher at [facility].     Action/Plan:   Home when stable   Anticipated DC Date:     Anticipated DC Plan:  SNF vs HH      DC Planning Services  CM consult      Choice offered to / List presented to:  NA   DME arranged  NA      DME agency  NA     HH arranged  NA      HH agency  NA   Status of service:  In process, will continue to follow Medicare Important Message given?   (If response is "NO", the following Medicare IM given date fields will be blank) Date Medicare IM given:   Date Additional Medicare IM given:    Discharge Disposition:    Per UR Regulation:  Reviewed for med. necessity/level of care/duration of stay  If discussed at Long Length of Stay Meetings, dates discussed:    Comments:  07/23/12 1229 Leonie Green [phone] Cm spoke with pt' daughter and son-n-law at the bedside concerning discharge planning. PT eval ordered per MD. Per pt's daughter, currently residing in their home. Per adult daughter, patient is requiring more care than family able to provide. Cm informed patient and daughter SNF placement depends on PT eval. Family verbalized this understanding.   07/23/12 1142 Syon Tews,RN,BSN [phone] Chart reviewed for utilization of services. No needs idnetified at this time.

## 2012-07-24 LAB — BASIC METABOLIC PANEL
BUN: 17 mg/dL (ref 6–23)
CO2: 29 mEq/L (ref 19–32)
Chloride: 105 mEq/L (ref 96–112)
Glucose, Bld: 92 mg/dL (ref 70–99)
Potassium: 4 mEq/L (ref 3.5–5.1)
Sodium: 142 mEq/L (ref 135–145)

## 2012-07-24 LAB — CBC
HCT: 35.5 % — ABNORMAL LOW (ref 36.0–46.0)
Hemoglobin: 12 g/dL (ref 12.0–15.0)
MCH: 26.5 pg (ref 26.0–34.0)
MCHC: 33.8 g/dL (ref 30.0–36.0)
MCV: 78.5 fL (ref 78.0–100.0)
RBC: 4.52 MIL/uL (ref 3.87–5.11)

## 2012-07-24 MED ORDER — SULFAMETHOXAZOLE-TMP DS 800-160 MG PO TABS: 1.0000 | ORAL_TABLET | Freq: Two times a day (BID) | ORAL | Status: DC

## 2012-07-24 MED ORDER — VANCOMYCIN HCL IN DEXTROSE 1-5 GM/200ML-% IV SOLN: 1000.0000 mg | INTRAVENOUS | Status: DC

## 2012-07-24 MED ORDER — OXYCODONE HCL 5 MG PO TABS: 5.0000 mg | ORAL_TABLET | ORAL | Status: DC | PRN

## 2012-07-24 NOTE — Discharge Summary (Signed)
Physician Discharge Summary  Shelley Fowler OZH:[id number] DOB: [birth year] DOA: 07/22/2012  PCP: No PCP Per Patient  Admit date: 07/22/2012 Discharge date: 07/24/2012  Recommendations for Outpatient Follow-up:  1. Pt will need to follow up with PCP in 2-3 weeks post discharge 2. Please obtain BMP to evaluate electrolytes and kidney function 3. Please also check CBC to evaluate Hg and Hct levels 4. Please note that pt was discharged on Bactrim to complete therapy for left leg cellulitis for 10 more days post discharge 5. Pt was advised to stop taking Cephalexin   Discharge Diagnoses: Left leg cellulitis Active Problems:   Cellulitis of leg, left   Alzheimer's disease  Discharge Condition: Stable  Diet recommendation: Heart healthy diet discussed in details   Brief narrative:  79 year old female, with known history of dementia, s/p remote TAH, presenting with progressive swelling and redness of LLE. According to her daughter, who was at the bedside, and provided the history, patient was in her normal state of health until morning of 07/17/12, when her family noted that her left lower extremity appeared to be swollen, red, tender and warm to touch, associated with subjective fevers and chills. Patient was seen at [facility] on 07/18/12., where she was evaluated by a Dr Clelia Croft, prescribed Keflex 4 x daily, and recommended to elevate the affected keg. Patient was compliant with medication, but symptoms have progressed, the left lower leg appeared darker and cold on 07/20/12, and by evening of 07/21/12, the redness had reached up to the upper 2/3 of her left leg.   Active Problems:  Cellulitis of leg, left  - clinically improving and less tender to palpation  - continued Vancomycin and Zosyn day #3, pt will complete therapy with Bactrim for 10 more days post discharge  - keeping left extremity elevated as possible, advised  Alzheimer's disease  - PT evaluation done, PT cleared for discharge    Consultants:  None Procedures/Studies:  07/22/2012 - Left lower extremity venous duplex. Preliminary report: Left: No evidence of DVT, superficial thrombosis, or Baker's cyst.  Antibiotics:  Vancomycin 7/17 --> 7/19 Zosyn 7/17 --> 7/19 Bactrim 7/19 --> 10 more days post discharge   Code Status: DNR  Family Communication: Pt at bedside   Discharge Exam: Filed Vitals:   07/24/12 0500  BP: 130/76  Pulse: 62  Temp: 97.5 F (36.4 C)  Resp: 16   Filed Vitals:   07/23/12 0655 07/23/12 1430 07/23/12 2017 07/24/12 0500  BP: 125/64 106/91 126/58 130/76  Pulse: 56 76 69 62  Temp: 97.4 F (36.3 C) 97.7 F (36.5 C) 98.8 F (37.1 C) 97.5 F (36.4 C)  TempSrc: Axillary  Oral Oral  Resp: 16 16 16 16   Height:      Weight:      SpO2: 100% 100% 100% 97%    General: Pt is alert, follows commands appropriately, not in acute distress Cardiovascular: Regular rate and rhythm, S1/S2 +, no murmurs, no rubs, no gallops Respiratory: Clear to auscultation bilaterally, no wheezing, no crackles, no rhonchi Abdominal: Soft, non tender, non distended, bowel sounds +, no guarding Extremities: left lower extremity edema and erythema improving, no tenderness to palpation, no cyanosis, pulses palpable bilaterally DP and PT Neuro: Grossly nonfocal  Discharge Instructions:    Medication List    STOP taking these medications       cephALEXin 500 MG capsule  Commonly known as:  KEFLEX      TAKE these medications       oxyCODONE  5 MG immediate release tablet  Commonly known as:  Oxy IR/ROXICODONE  Take 1 tablet (5 mg total) by mouth every 4 (four) hours as needed.     sulfamethoxazole-trimethoprim 800-160 MG per tablet  Commonly known as:  BACTRIM DS  Take 1 tablet by mouth 2 (two) times daily.           Follow-up Information   Follow up with No PCP Per Patient. (As needed if symptoms worsen)    Contact information:   [address] [place] [address] [phone]         The results of significant diagnostics from this hospitalization (including imaging, microbiology, ancillary and laboratory) are listed below for reference.     Microbiology: Recent Results (from the past 240 hour(s))  CULTURE, BLOOD (ROUTINE X 2)     Status: None   Collection Time    07/22/12  4:30 PM      Result Value Range Status   Specimen Description BLOOD RIGHT ARM   Final   Special Requests BOTTLES DRAWN AEROBIC AND ANAEROBIC   Final   Culture  Setup Time 07/22/2012 22:48   Final   Culture     Final   Value:        BLOOD CULTURE RECEIVED NO GROWTH TO DATE CULTURE WILL BE HELD FOR 5 DAYS BEFORE ISSUING A FINAL NEGATIVE REPORT   Report Status PENDING   Incomplete  CULTURE, BLOOD (ROUTINE X 2)     Status: None   Collection Time    07/22/12  4:35 PM      Result Value Range Status   Specimen Description BLOOD RIGHT HAND   Final   Special Requests BOTTLES DRAWN AEROBIC AND ANAEROBIC   Final   Culture  Setup Time 07/22/2012 22:48   Final   Culture     Final   Value:        BLOOD CULTURE RECEIVED NO GROWTH TO DATE CULTURE WILL BE HELD FOR 5 DAYS BEFORE ISSUING A FINAL NEGATIVE REPORT   Report Status PENDING   Incomplete     Labs: Basic Metabolic Panel:  Recent Labs Lab 07/22/12 1145 07/23/12 0414 07/24/12 0500  NA 142 141 142  K 3.6 3.6 4.0  CL 106 106 105  CO2 28 30 29   GLUCOSE 72 113* 92  BUN 14 13 17   CREATININE 0.90 0.99 1.19*  CALCIUM 9.5 9.0 9.4   Liver Function Tests:  Recent Labs Lab 07/23/12 0414  AST 20  ALT 10  ALKPHOS 47  BILITOT 0.6  PROT 6.3  ALBUMIN 2.8*   CBC:  Recent Labs Lab 07/22/12 1145 07/23/12 0414 07/24/12 0500  WBC 7.6 7.3 8.7  NEUTROABS 5.2  --   --   HGB 12.3 11.2* 12.0  HCT 35.9* 33.0* 35.5*  MCV 78.6 78.9 78.5  PLT 347 314 332   SIGNED: Time coordinating discharge: Over 30 minutes  Debbora Presto, MD  [facility] 07/24/2012, 9:23 AM Pager [phone]  If 7PM-7AM, please contact  night-coverage [email or web address] Password [email or web address]

## 2012-07-24 NOTE — Evaluation (Signed)
Physical Therapy Evaluation Patient Details Name: Shelley Fowler MRN: [id number] DOB: 02/02/1925 Today's Date: 07/24/2012 Time: 1050-1110 PT Time Calculation (min): 20 min  PT Assessment / Plan / Recommendation History of Present Illness  79 year old female, with known history of dementia, s/p remote TAH, presenting with progressive swelling and redness of LLE  Pt with dx of cellulitis  Clinical Impression  Pt pleasant and cooperative, but is limited by dementia.  She is able to perform bed mobility and ambulate without a device. She does not need further skilled PT. Recommend she continue to walk prn with nursing and / or family to prevent deconditioning    PT Assessment  Patent does not need any further PT services    Follow Up Recommendations  No PT follow up    Does the patient have the potential to tolerate intense rehabilitation      Barriers to Discharge        Equipment Recommendations       Recommendations for Other Services     Frequency      Precautions / Restrictions Precautions Precautions: Fall   Pertinent Vitals/Pain No c/o pain      Mobility  Bed Mobility Bed Mobility: Sit to Supine;Supine to Sit Supine to Sit: 5: Supervision Sit to Supine: 5: Supervision Transfers Transfers: Sit to Stand;Stand to Sit Sit to Stand: 5: Supervision Stand to Sit: 5: Supervision Ambulation/Gait Ambulation/Gait Assistance: 5: Supervision;4: Min guard Ambulation Distance (Feet): 200 Feet Assistive device: None Ambulation/Gait Assistance Details: pt with no balance loss.  Encouragement only to continue walking Gait Pattern: Within Functional Limits Gait velocity: WFL General Gait Details: Pt does not follow directions for safety.  She was able to maintain balance and ambulate without device or complaint Stairs: No Wheelchair Mobility Wheelchair Mobility: No    Exercises     PT Diagnosis:    PT Problem List:   PT Treatment Interventions:       PT Goals(Current  goals can be found in the care plan section) Acute Rehab PT Goals Patient Stated Goal: pt is happy to walk PT Goal Formulation: Patient unable to participate in goal setting  Visit Information  Last PT Received On: 07/24/12 History of Present Illness: 79 year old female, with known history of dementia, s/p remote TAH, presenting with progressive swelling and redness of LLE  Pt with dx of cellulitis       Prior Functioning  Home Living Additional Comments: family not present.  Pt unable to give further information Prior Function Level of Independence: Needs assistance Comments: pt uanble to follow commands Communication Communication: No difficulties    Cognition  Cognition Arousal/Alertness: Awake/alert Behavior During Therapy:  (follows manual guidance) Overall Cognitive Status: No family/caregiver present to determine baseline cognitive functioning Memory: Decreased recall of precautions;Decreased short-term memory    Extremity/Trunk Assessment Lower Extremity Assessment Lower Extremity Assessment: Overall WFL for tasks assessed Cervical / Trunk Assessment Cervical / Trunk Assessment: Normal   Balance Balance Balance Assessed: Yes Static Sitting Balance Static Sitting - Balance Support: No upper extremity supported Static Sitting - Level of Assistance: 7: Independent Static Standing Balance Static Standing - Balance Support: No upper extremity supported Static Standing - Level of Assistance: 5: Stand by assistance;7: Independent  End of Session PT - End of Session Activity Tolerance: Patient tolerated treatment well Patient left: in bed;with bed alarm set  GP    [name], [place] [phone] 07/24/2012, 11:36 AM

## 2012-07-24 NOTE — Progress Notes (Signed)
Patient discharged to home with family, discharge instructions reviewed with patients daughter who verbalized understanding. New RX's given to daughter.

## 2012-07-24 NOTE — Progress Notes (Signed)
ANTIBIOTIC CONSULT NOTE - Follow Up  Pharmacy Consult for Zosyn/vancomycin Indication: Cellulitis   No Known Allergies  Patient Measurements: Height: 5' 7.5" (171.5 cm) Weight: 114 lb (51.71 kg) IBW/kg (Calculated) : 62.75   Vital Signs: Temp: 97.5 F (36.4 C) (07/19 0500) Temp src: Oral (07/19 0500) BP: 130/76 mmHg (07/19 0500) Pulse Rate: 62 (07/19 0500) Intake/Output from previous day: 07/18 0701 - 07/19 0700 In: 720 [P.O.:720] Out: 700 [Urine:700] Intake/Output from this shift: Total I/O In: 480 [P.O.:480] Out: 200 [Urine:200]  Labs:  Recent Labs  07/22/12 1145 07/23/12 0414 07/24/12 0500  WBC 7.6 7.3 8.7  HGB 12.3 11.2* 12.0  PLT 347 314 332  CREATININE 0.90 0.99 1.19*   Estimated Creatinine Clearance: 28.7 ml/min (by C-G formula based on Cr of 1.19). No results found for this basename: VANCOTROUGH, VANCOPEAK, VANCORANDOM, GENTTROUGH, GENTPEAK, GENTRANDOM, TOBRATROUGH, TOBRAPEAK, TOBRARND, AMIKACINPEAK, AMIKACINTROU, AMIKACIN,  in the last 72 hours   Microbiology: Recent Results (from the past 720 hour(s))  CULTURE, BLOOD (ROUTINE X 2)     Status: None   Collection Time    07/22/12  4:30 PM      Result Value Range Status   Specimen Description BLOOD RIGHT ARM   Final   Special Requests BOTTLES DRAWN AEROBIC AND ANAEROBIC   Final   Culture  Setup Time 07/22/2012 22:48   Final   Culture     Final   Value:        BLOOD CULTURE RECEIVED NO GROWTH TO DATE CULTURE WILL BE HELD FOR 5 DAYS BEFORE ISSUING A FINAL NEGATIVE REPORT   Report Status PENDING   Incomplete  CULTURE, BLOOD (ROUTINE X 2)     Status: None   Collection Time    07/22/12  4:35 PM      Result Value Range Status   Specimen Description BLOOD RIGHT HAND   Final   Special Requests BOTTLES DRAWN AEROBIC AND ANAEROBIC   Final   Culture  Setup Time 07/22/2012 22:48   Final   Culture     Final   Value:        BLOOD CULTURE RECEIVED NO GROWTH TO DATE CULTURE WILL BE HELD FOR 5 DAYS BEFORE  ISSUING A FINAL NEGATIVE REPORT   Report Status PENDING   Incomplete    Medical History: Past Medical History  Diagnosis Date  . Dementia     Medications:  Prescriptions prior to admission  Medication Sig Dispense Refill  . [DISCONTINUED] cephALEXin (KEFLEX) 500 MG capsule Take 1 capsule (500 mg total) by mouth 4 (four) times daily.  28 capsule  0   Scheduled:  . heparin  5,000 Units Subcutaneous Q8H  . piperacillin-tazobactam (ZOSYN)  IV  3.375 g Intravenous Q8H  . pneumococcal 23 valent vaccine  0.5 mL Intramuscular Tomorrow-1000  . sodium chloride  3 mL Intravenous Q12H  . [START ON 07/25/2012] vancomycin  1,000 mg Intravenous Q48H   Infusions:    PRN: sodium chloride, acetaminophen, acetaminophen, haloperidol lactate, morphine injection, ondansetron (ZOFRAN) IV, ondansetron, oxyCODONE, sodium chloride Anti-infectives   Start     Dose/Rate Route Frequency Ordered Stop   07/25/12 1200  vancomycin (VANCOCIN) IVPB 1000 mg/200 mL premix     1,000 mg 200 mL/hr over 60 Minutes Intravenous Every 48 hours 07/24/12 1258     07/24/12 0000  sulfamethoxazole-trimethoprim (BACTRIM DS) 800-160 MG per tablet     1 tablet Oral 2 times daily 07/24/12 0923     07/23/12 1400  vancomycin (VANCOCIN)  500 mg in sodium chloride 0.9 % 100 mL IVPB  Status:  Discontinued     500 mg 100 mL/hr over 60 Minutes Intravenous Every 24 hours 07/22/12 1312 07/24/12 1258   07/23/12 0000  sulfamethoxazole-trimethoprim (BACTRIM DS) 800-160 MG per tablet  Status:  Discontinued     1 tablet Oral 2 times daily 07/23/12 1934 07/24/12    07/22/12 1600  piperacillin-tazobactam (ZOSYN) IVPB 3.375 g     3.375 g 12.5 mL/hr over 240 Minutes Intravenous Every 8 hours 07/22/12 1515     07/22/12 1315  vancomycin (VANCOCIN) 500 mg in sodium chloride 0.9 % 100 mL IVPB     500 mg 100 mL/hr over 60 Minutes Intravenous To Emergency Dept 07/22/12 1311 07/22/12 1421   07/22/12 1300  vancomycin (VANCOCIN) IVPB 1000 mg/200 mL  premix  Status:  Discontinued     1,000 mg 200 mL/hr over 60 Minutes Intravenous To Emergency Dept 07/22/12 1250 07/22/12 1311     Assessment: 79 yo F with progressive cellulitis of L leg since 07/17/12, despite use of keflex PTA since 07/18/12.    D#3 Vanc 500mg  IV q24h, zosyn 3.375g IV q8h  Scr up some 0.9 > 1.19, CrCl ~ 29 ml/min  WBC wnl, Afebrile  Blood cx x 2 7/17 ngtd  Goal of Therapy:  Vancomycin trough 10-15 Zosyn per renal function   Plan:  Decrease vancomycin to 1g IV q48h Continue Zosyn 3.375 grams IV q8h Monitor labs, vitals and cx VT @ Css if necessary Adjust doses as appropriate  Gwen Her PharmD  [phone] 07/24/2012 1:01 PM

## 2012-07-24 NOTE — Progress Notes (Signed)
CSW consult received.  Pt d/c'd prior to being seen by CSW.  Providence Crosby, LCSWA Clinical Social Work [phone]

## 2012-07-28 LAB — CULTURE, BLOOD (ROUTINE X 2): Culture: NO GROWTH

## 2013-12-02 ENCOUNTER — Emergency Department (HOSPITAL_COMMUNITY): Payer: Medicare Other

## 2013-12-02 ENCOUNTER — Emergency Department (HOSPITAL_COMMUNITY)
Admission: EM | Admit: 2013-12-02 | Discharge: 2013-12-02 | Disposition: A | Discharge status: Home / Self Care | Payer: Medicare Other | Attending: Emergency Medicine | Admitting: Emergency Medicine

## 2013-12-02 ENCOUNTER — Encounter (HOSPITAL_COMMUNITY): Payer: Self-pay | Admitting: Family Medicine

## 2013-12-02 DIAGNOSIS — Z872 Personal history of diseases of the skin and subcutaneous tissue: Secondary | ICD-10-CM | POA: Diagnosis not present

## 2013-12-02 DIAGNOSIS — Y9389 Activity, other specified: Secondary | ICD-10-CM | POA: Diagnosis not present

## 2013-12-02 DIAGNOSIS — Y9289 Other specified places as the place of occurrence of the external cause: Secondary | ICD-10-CM | POA: Insufficient documentation

## 2013-12-02 DIAGNOSIS — S91111A Laceration without foreign body of right great toe without damage to nail, initial encounter: Secondary | ICD-10-CM | POA: Insufficient documentation

## 2013-12-02 DIAGNOSIS — Z792 Long term (current) use of antibiotics: Secondary | ICD-10-CM | POA: Insufficient documentation

## 2013-12-02 DIAGNOSIS — T1490XA Injury, unspecified, initial encounter: Secondary | ICD-10-CM

## 2013-12-02 DIAGNOSIS — Y998 Other external cause status: Secondary | ICD-10-CM | POA: Insufficient documentation

## 2013-12-02 DIAGNOSIS — S92421B Displaced fracture of distal phalanx of right great toe, initial encounter for open fracture: Secondary | ICD-10-CM

## 2013-12-02 DIAGNOSIS — Z23 Encounter for immunization: Secondary | ICD-10-CM | POA: Insufficient documentation

## 2013-12-02 DIAGNOSIS — S8991XA Unspecified injury of right lower leg, initial encounter: Secondary | ICD-10-CM | POA: Insufficient documentation

## 2013-12-02 DIAGNOSIS — F0390 Unspecified dementia without behavioral disturbance: Secondary | ICD-10-CM | POA: Insufficient documentation

## 2013-12-02 DIAGNOSIS — W208XXA Other cause of strike by thrown, projected or falling object, initial encounter: Secondary | ICD-10-CM | POA: Insufficient documentation

## 2013-12-02 MED ORDER — TETANUS-DIPHTH-ACELL PERTUSSIS 5-2.5-18.5 LF-MCG/0.5 IM SUSP
0.5000 mL | Freq: Once | INTRAMUSCULAR | Status: AC
  Administered 2013-12-02: 0.5 mL via INTRAMUSCULAR
  Filled 2013-12-02: qty 0.5

## 2013-12-02 MED ORDER — LIDOCAINE HCL 2 % IJ SOLN
10.0000 mL | Freq: Once | INTRAMUSCULAR | Status: AC
Start: 2013-12-02 — End: 2013-12-02
  Administered 2013-12-02: 200 mg
  Filled 2013-12-02: qty 20

## 2013-12-02 MED ORDER — CEPHALEXIN 500 MG PO CAPS: 500.0000 mg | ORAL_CAPSULE | Freq: Three times a day (TID) | ORAL | Status: DC

## 2013-12-02 MED ORDER — CEPHALEXIN 500 MG PO CAPS
500.0000 mg | ORAL_CAPSULE | Freq: Once | ORAL | Status: AC
  Administered 2013-12-02: 500 mg via ORAL
  Filled 2013-12-02: qty 1

## 2013-12-02 NOTE — ED Notes (Signed)
Bed: [id number] Expected date:  Expected time:  Means of arrival:  Comments: EMS 80 yo female/Assisted Living-pulled TV off onto toe-laceration

## 2013-12-02 NOTE — Discharge Instructions (Signed)
Suture needs to be removed in 7 days. The toe needs to be checked by the orthopedic doctor to make sure no infection has gotten into the bone.  Laceration Care, Adult A laceration is a cut or lesion that goes through all layers of the skin and into the tissue just beneath the skin. TREATMENT  Some lacerations may not require closure. Some lacerations may not be able to be closed due to an increased risk of infection. It is important to see your caregiver as soon as possible after an injury to minimize the risk of infection and maximize the opportunity for successful closure. If closure is appropriate, pain medicines may be given, if needed. The wound will be cleaned to help prevent infection. Your caregiver will use stitches (sutures), staples, wound glue (adhesive), or skin adhesive strips to repair the laceration. These tools bring the skin edges together to allow for faster healing and a better cosmetic outcome. However, all wounds will heal with a scar. Once the wound has healed, scarring can be minimized by covering the wound with sunscreen during the day for 1 full year. HOME CARE INSTRUCTIONS  For sutures or staples:  Keep the wound clean and dry.  If you were given a bandage (dressing), you should change it at least once a day. Also, change the dressing if it becomes wet or dirty, or as directed by your caregiver.  Wash the wound with soap and water 2 times a day. Rinse the wound off with water to remove all soap. Pat the wound dry with a clean towel.  After cleaning, apply a thin layer of the antibiotic ointment as recommended by your caregiver. This will help prevent infection and keep the dressing from sticking.  You may shower as usual after the first 24 hours. Do not soak the wound in water until the sutures are removed.  Only take over-the-counter or prescription medicines for pain, discomfort, or fever as directed by your caregiver.  Get your sutures or staples removed as  directed by your caregiver. For skin adhesive strips:  Keep the wound clean and dry.  Do not get the skin adhesive strips wet. You may bathe carefully, using caution to keep the wound dry.  If the wound gets wet, pat it dry with a clean towel.  Skin adhesive strips will fall off on their own. You may trim the strips as the wound heals. Do not remove skin adhesive strips that are still stuck to the wound. They will fall off in time. For wound adhesive:  You may briefly wet your wound in the shower or bath. Do not soak or scrub the wound. Do not swim. Avoid periods of heavy perspiration until the skin adhesive has fallen off on its own. After showering or bathing, gently pat the wound dry with a clean towel.  Do not apply liquid medicine, cream medicine, or ointment medicine to your wound while the skin adhesive is in place. This may loosen the film before your wound is healed.  If a dressing is placed over the wound, be careful not to apply tape directly over the skin adhesive. This may cause the adhesive to be pulled off before the wound is healed.  Avoid prolonged exposure to sunlight or tanning lamps while the skin adhesive is in place. Exposure to ultraviolet light in the first year will darken the scar.  The skin adhesive will usually remain in place for 5 to 10 days, then naturally fall off the skin. Do not pick  at the adhesive film. You may need a tetanus shot if:  You cannot remember when you had your last tetanus shot.  You have never had a tetanus shot. If you get a tetanus shot, your arm may swell, get red, and feel warm to the touch. This is common and not a problem. If you need a tetanus shot and you choose not to have one, there is a rare chance of getting tetanus. Sickness from tetanus can be serious. SEEK MEDICAL CARE IF:   You have redness, swelling, or increasing pain in the wound.  You see a red line that goes away from the wound.  You have yellowish-white fluid  (pus) coming from the wound.  You have a fever.  You notice a bad smell coming from the wound or dressing.  Your wound breaks open before or after sutures have been removed.  You notice something coming out of the wound such as wood or glass.  Your wound is on your hand or foot and you cannot move a finger or toe. SEEK IMMEDIATE MEDICAL CARE IF:   Your pain is not controlled with prescribed medicine.  You have severe swelling around the wound causing pain and numbness or a change in color in your arm, hand, leg, or foot.  Your wound splits open and starts bleeding.  You have worsening numbness, weakness, or loss of function of any joint around or beyond the wound.  You develop painful lumps near the wound or on the skin anywhere on your body. MAKE SURE YOU:   Understand these instructions.  Will watch your condition.  Will get help right away if you are not doing well or get worse. Document Released: 12/23/2004 Document Revised: 03/17/2011 Document Reviewed: 06/18/2010 [facility] Patient Information 2015 [place], [place]. This information is not intended to replace advice given to you by your health care provider. Make sure you discuss any questions you have with your health care provider.  Cephalexin tablets or capsules What is this medicine? CEPHALEXIN (sef a LEX in) is a cephalosporin antibiotic. It is used to treat certain kinds of bacterial infections It will not work for colds, flu, or other viral infections. This medicine may be used for other purposes; ask your health care provider or pharmacist if you have questions. COMMON BRAND NAME(S): Biocef, Keflex, Keftab What should I tell my health care provider before I take this medicine? They need to know if you have any of these conditions: -kidney disease -stomach or intestine problems, especially colitis -an unusual or allergic reaction to cephalexin, other cephalosporins, penicillins, other antibiotics, medicines, foods,  dyes or preservatives -pregnant or trying to get pregnant -breast-feeding How should I use this medicine? Take this medicine by mouth with a full glass of water. Follow the directions on the prescription label. This medicine can be taken with or without food. Take your medicine at regular intervals. Do not take your medicine more often than directed. Take all of your medicine as directed even if you think you are better. Do not skip doses or stop your medicine early. Talk to your pediatrician regarding the use of this medicine in children. While this drug may be prescribed for selected conditions, precautions do apply. Overdosage: If you think you have taken too much of this medicine contact a poison control center or emergency room at once. NOTE: This medicine is only for you. Do not share this medicine with others. What if I miss a dose? If you miss a dose, take it as  soon as you can. If it is almost time for your next dose, take only that dose. Do not take double or extra doses. There should be at least 4 to 6 hours between doses. What may interact with this medicine? -probenecid -some other antibiotics This list may not describe all possible interactions. Give your health care provider a list of all the medicines, herbs, non-prescription drugs, or dietary supplements you use. Also tell them if you smoke, drink alcohol, or use illegal drugs. Some items may interact with your medicine. What should I watch for while using this medicine? Tell your doctor or health care professional if your symptoms do not begin to improve in a few days. Do not treat diarrhea with over the counter products. Contact your doctor if you have diarrhea that lasts more than 2 days or if it is severe and watery. If you have diabetes, you may get a false-positive result for sugar in your urine. Check with your doctor or health care professional. What side effects may I notice from receiving this medicine? Side effects that  you should report to your doctor or health care professional as soon as possible: -allergic reactions like skin rash, itching or hives, swelling of the face, lips, or tongue -breathing problems -pain or trouble passing urine -redness, blistering, peeling or loosening of the skin, including inside the mouth -severe or watery diarrhea -unusually weak or tired -yellowing of the eyes, skin Side effects that usually do not require medical attention (report to your doctor or health care professional if they continue or are bothersome): -gas or heartburn -genital or anal irritation -headache -joint or muscle pain -nausea, vomiting This list may not describe all possible side effects. Call your doctor for medical advice about side effects. You may report side effects to FDA at 1-800-FDA-1088. Where should I keep my medicine? Keep out of the reach of children. Store at room temperature between 59 and 86 degrees F (15 and 30 degrees C). Throw away any unused medicine after the expiration date. NOTE: This sheet is a summary. It may not cover all possible information. If you have questions about this medicine, talk to your doctor, pharmacist, or health care provider.  2015, [employer]vier/Gold Standard. (2007-03-29 17:09:13)

## 2013-12-02 NOTE — ED Notes (Signed)
Patient has a 0.5 inch laceration to left side of nail bed. Bleeding controlled.

## 2013-12-02 NOTE — ED Notes (Signed)
Per EMS, patient was walking in her room, pulled the television down. Television landed on right foot. Laceration to right big toe.

## 2013-12-02 NOTE — ED Notes (Signed)
Stretcher alarm placed.

## 2013-12-02 NOTE — ED Provider Notes (Signed)
CSN: [id number]     Arrival date & time 12/02/13  [id number] History   First MD Initiated Contact with Patient 12/02/13 [phone]     Chief Complaint  Patient presents with  . Laceration    Right Big Toe     (Consider location/radiation/quality/duration/timing/severity/associated sxs/prior Treatment) Patient is a 80 y.o. female presenting with skin laceration. The history is provided by the nursing home. The history is limited by the condition of the patient (Dementia).  Laceration She apparently pulled a television set, down and landed on her right foot, causing laceration to her right first toe. She has history of cellulitis of the leg with chronic changes. I can find no record of her tenderness immunization history.  Past Medical History  Diagnosis Date  . Dementia    Past Surgical History  Procedure Laterality Date  . Abdominal hysterectomy     History reviewed. No pertinent family history. History  Substance Use Topics  . Smoking status: Never Smoker   . Smokeless tobacco: Never Used  . Alcohol Use: No   OB History    No data available     Review of Systems  Unable to perform ROS: Dementia      Allergies  Review of patient's allergies indicates no known allergies.  Home Medications   Prior to Admission medications   Medication Sig Start Date End Date Taking? Authorizing Provider  oxyCODONE (OXY IR/ROXICODONE) 5 MG immediate release tablet Take 1 tablet (5 mg total) by mouth every 4 (four) hours as needed. 07/24/12   Dorothea OgleIskra M Myers, MD  sulfamethoxazole-trimethoprim (BACTRIM DS) 800-160 MG per tablet Take 1 tablet by mouth 2 (two) times daily. 07/24/12   Dorothea OgleIskra M Myers, MD   BP 135/69 mmHg  Pulse 64  Temp(Src) 98.3 F (36.8 C) (Oral)  Resp 22  Ht 5\' 9"  (1.753 m)  Wt 114 lb (51.71 kg)  BMI 16.83 kg/m2  SpO2 98% Physical Exam  Nursing note and vitals reviewed.  80 year old female, resting comfortably and in no acute distress. Vital signs are significant for  tachypnea. Oxygen saturation is 98%, which is normal. Head is normocephalic and atraumatic. PERRLA, EOMI. Oropharynx is clear. Neck is nontender and supple without adenopathy or JVD. Back is nontender and there is no CVA tenderness. Lungs are clear without rales, wheezes, or rhonchi. Chest is nontender. Heart has regular rate and rhythm without murmur. Abdomen is soft, flat, nontender without masses or hepatosplenomegaly and peristalsis is normoactive. Extremities have 1-2+ edema with mild erythema which apparently is chronic. There is a flap laceration of the medial aspect of the right first toe distal phalanx without obvious deformity. Skin is warm and dry without rash. Neurologic: She is awake and alert but not oriented, cranial nerves are intact, there are no motor or sensory deficits.  ED Course  Procedures (including critical care time) LACERATION REPAIR Performed by: EXBMW,UXLKGGLICK,Margaretmary Prisk Authorized by: MWNUU,VOZDGGLICK,Yun Gutierrez Consent: Verbal consent obtained. Risks and benefits: risks, benefits and alternatives were discussed Consent given by: patient Patient identity confirmed: provided demographic data Prepped and Draped in normal sterile fashion Wound explored  Laceration Location: right first toe   Laceration Length: 0.5 cm  No Foreign Bodies seen or palpated  Anesthesia: digital block  Local anesthetic: lidocaine 2% without epinephrine  Anesthetic total: 8 ml  Irrigation method: syringe Amount of cleaning: standard Devitalized epidermal layer debrided by sharp excision  Skin closure: close  Number of sutures: 1   Technique: simple interrupted with 4-0 Nylon  Patient tolerance: Patient  tolerated the procedure well with no immediate complications.  Imaging Review Dg Toe Great Right  12/02/2013   CLINICAL DATA:  Right great toe pain and laceration after walking in to her room and pulling a television down. The television landed on the right foot.  EXAM: RIGHT GREAT TOE   COMPARISON:  None.  FINDINGS: Soft tissue irregularity on the medial aspect of the great toe. Possible small, nondisplaced fracture in the medial aspect of the midportion of the first distal phalanx. No other fractures and no dislocation.  IMPRESSION: Possible small, nondisplaced fracture in the medial aspect of the mid portion of the first distal phalanx with an overlying soft tissue laceration.   Electronically Signed   By: Gordan PaymentSteve  Reid M.D.   On: 12/02/2013 07:34   Images viewed by me.  MDM   Final diagnoses:  Blunt trauma  Blunt trauma of right lower leg, initial encounter  Laceration of right great toe, initial encounter  Open displaced fracture of distal phalanx of right great toe, initial encounter    Injury to the right first toe. She'll be sent for x-ray to evaluate for possible fracture and will need laceration repair. Old records are reviewed and she has prior hospitalizations for cellulitis. I could not find any record of prior tetanus immunization. TDaP booster is given.  Once adequate anesthesia of the toe had been obtained following digital block, the laceration was cleaned and it was observed to be a near complete epidermal avulsion which was debrided by sharp dissection. There did seem to be a small underlying laceration. X-ray shows a questionable nondisplaced fracture but in a location that could be related to the trauma so she will be treated for possible open fracture. She is discharged with prescription for cephalexin and is referred to orthopedics for follow-up. Family was present for the wound repair and are aware of the possible open fracture.    Dione Boozeavid Isabeau Mccalla, MD 12/02/13 [id number]

## 2013-12-02 NOTE — ED Notes (Signed)
Patient transported to X-ray 

## 2014-06-26 ENCOUNTER — Other Ambulatory Visit: Payer: Self-pay | Admitting: *Deleted

## 2014-06-26 DIAGNOSIS — Z01812 Encounter for preprocedural laboratory examination: Secondary | ICD-10-CM

## 2015-02-23 ENCOUNTER — Emergency Department (HOSPITAL_COMMUNITY)
Admission: EM | Admit: 2015-02-23 | Discharge: 2015-02-23 | Disposition: A | Discharge status: Home / Self Care | Payer: Medicare Other | Attending: Physician Assistant | Admitting: Physician Assistant

## 2015-02-23 ENCOUNTER — Encounter (HOSPITAL_COMMUNITY): Payer: Self-pay | Admitting: *Deleted

## 2015-02-23 ENCOUNTER — Emergency Department (HOSPITAL_COMMUNITY): Payer: Medicare Other

## 2015-02-23 DIAGNOSIS — F0390 Unspecified dementia without behavioral disturbance: Secondary | ICD-10-CM | POA: Diagnosis not present

## 2015-02-23 DIAGNOSIS — Z79899 Other long term (current) drug therapy: Secondary | ICD-10-CM | POA: Insufficient documentation

## 2015-02-23 DIAGNOSIS — R630 Anorexia: Secondary | ICD-10-CM | POA: Diagnosis not present

## 2015-02-23 DIAGNOSIS — R509 Fever, unspecified: Secondary | ICD-10-CM | POA: Diagnosis not present

## 2015-02-23 DIAGNOSIS — R0981 Nasal congestion: Secondary | ICD-10-CM | POA: Insufficient documentation

## 2015-02-23 DIAGNOSIS — R000 Tachycardia, unspecified: Secondary | ICD-10-CM | POA: Insufficient documentation

## 2015-02-23 LAB — COMPREHENSIVE METABOLIC PANEL
ALT: 17 U/L (ref 14–54)
AST: 26 U/L (ref 15–41)
Albumin: 3.7 g/dL (ref 3.5–5.0)
Alkaline Phosphatase: 58 U/L (ref 38–126)
Anion gap: 8 (ref 5–15)
BUN: 23 mg/dL — ABNORMAL HIGH (ref 6–20)
CHLORIDE: 104 mmol/L (ref 101–111)
CO2: 28 mmol/L (ref 22–32)
Calcium: 9.6 mg/dL (ref 8.9–10.3)
Creatinine, Ser: 1.07 mg/dL — ABNORMAL HIGH (ref 0.44–1.00)
GFR, EST AFRICAN AMERICAN: 53 mL/min — AB (ref >60)
GFR, EST NON AFRICAN AMERICAN: 45 mL/min — AB (ref >60)
Glucose, Bld: 100 mg/dL — ABNORMAL HIGH (ref 65–99)
POTASSIUM: 4.7 mmol/L (ref 3.5–5.1)
Sodium: 140 mmol/L (ref 135–145)
Total Bilirubin: 0.8 mg/dL (ref 0.3–1.2)
Total Protein: 7.5 g/dL (ref 6.5–8.1)

## 2015-02-23 LAB — URINALYSIS, ROUTINE W REFLEX MICROSCOPIC
BILIRUBIN URINE: NEGATIVE
GLUCOSE, UA: NEGATIVE mg/dL
Ketones, ur: NEGATIVE mg/dL
Nitrite: NEGATIVE
PH: 7.5 (ref 5.0–8.0)
Protein, ur: NEGATIVE mg/dL
SPECIFIC GRAVITY, URINE: 1.012 (ref 1.005–1.030)

## 2015-02-23 LAB — CBC WITH DIFFERENTIAL/PLATELET
Basophils Absolute: 0 10*3/uL (ref 0.0–0.1)
Basophils Relative: 0 %
EOS ABS: 0.1 10*3/uL (ref 0.0–0.7)
Eosinophils Relative: 0 %
HCT: 38.2 % (ref 36.0–46.0)
HEMOGLOBIN: 12.9 g/dL (ref 12.0–15.0)
LYMPHS ABS: 1.5 10*3/uL (ref 0.7–4.0)
LYMPHS PCT: 13 %
MCH: 26.8 pg (ref 26.0–34.0)
MCHC: 33.8 g/dL (ref 30.0–36.0)
MCV: 79.3 fL (ref 78.0–100.0)
MONOS PCT: 7 %
Monocytes Absolute: 0.8 10*3/uL (ref 0.1–1.0)
NEUTROS PCT: 80 %
Neutro Abs: 9.2 10*3/uL — ABNORMAL HIGH (ref 1.7–7.7)
Platelets: 250 10*3/uL (ref 150–400)
RBC: 4.82 MIL/uL (ref 3.87–5.11)
RDW: 15.3 % (ref 11.5–15.5)
WBC: 11.6 10*3/uL — AB (ref 4.0–10.5)

## 2015-02-23 LAB — URINE MICROSCOPIC-ADD ON: Bacteria, UA: NONE SEEN

## 2015-02-23 MED ORDER — DM-GUAIFENESIN ER 30-600 MG PO TB12: 1.0000 | ORAL_TABLET | Freq: Two times a day (BID) | ORAL | Status: DC

## 2015-02-23 MED ORDER — SODIUM CHLORIDE 0.9 % IV BOLUS (SEPSIS): 1000.0000 mL | Freq: Once | INTRAVENOUS | Status: DC

## 2015-02-23 NOTE — ED Notes (Signed)
Unable to collect labs patient going to xray 

## 2015-02-23 NOTE — ED Notes (Signed)
[facility] here to transport pt back to [facility]. 

## 2015-02-23 NOTE — ED Notes (Signed)
Per EMS report: pt coming from [facility] Dementia Unit: Staff reports had a fever of 99.9, a runny nose, and decrease in her appetite.  Pt was given tylenol at 12:45 with no improvement.  EMS reports bilateral breath sounds.  Hx of dementia.  Pt at baseline.  Pt makes sound but it's incomprehensible.

## 2015-02-23 NOTE — ED Notes (Signed)
[facility] was called and report on pt's discharge was given.

## 2015-02-23 NOTE — ED Notes (Signed)
Bed: WA09 Expected date:  Expected time:  Means of arrival:  Comments: EMS- 82 yo URI

## 2015-02-23 NOTE — ED Notes (Signed)
NURSE WILL COLLECT BLOOD WHEN THEY PUT IV IN PER MD

## 2015-02-23 NOTE — ED Notes (Signed)
[facility] was called for pt's transportation back to [facility]. 

## 2015-02-23 NOTE — Discharge Instructions (Signed)
Patient was found to have no evidence of fever here. Patient has no evidence of pneumonia or urinary tract infection. Please continue to treat nasal congestion at home with bulb and suction or mucinex.

## 2015-02-23 NOTE — ED Provider Notes (Signed)
CSN: [id number]     Arrival date & time 02/23/15  1426 History   First MD Initiated Contact with Patient 02/23/15 1459     Chief Complaint  Patient presents with  . Nasal Congestion     (Consider location/radiation/quality/duration/timing/severity/associated sxs/prior Treatment) HPI   Patient is a 82 year old female with advanced dementia. Patient completely non-participatory on exam.  Called [facility] in the dementia unit where she lives. They stated that she's been eating less than usual, low-grade fever, with congestion. No urinary complaints or symptoms.   They have called her daughter [phone]  Level V caveat dementia   Past Medical History  Diagnosis Date  . Dementia    Past Surgical History  Procedure Laterality Date  . Abdominal hysterectomy     No family history on file. Social History  Substance Use Topics  . Smoking status: Never Smoker   . Smokeless tobacco: Never Used  . Alcohol Use: No   OB History    No data available     Review of Systems  Unable to perform ROS: Dementia  Constitutional: Positive for fever, activity change and appetite change.  HENT: Positive for congestion.       Allergies  Review of patient's allergies indicates no known allergies.  Home Medications   Prior to Admission medications   Medication Sig Start Date End Date Taking? Authorizing Provider  Chloroxylenol-Zinc Oxide (BAZA EX) Apply 1 application topically 2 (two) times daily. Apply to buttocks twice daily after each incontinence episode   Yes Historical Provider, MD  Cholecalciferol (VITAMIN D-3) 1000 UNITS CAPS Take 1,000 Units by mouth daily.    Yes Historical Provider, MD  memantine (NAMENDA) 5 MG tablet Take 5 mg by mouth 2 (two) times daily.   Yes Historical Provider, MD  NUTRITIONAL SUPPLEMENT LIQD Take 1 Bottle by mouth 3 (three) times daily. Mighty Shake   Yes Historical Provider, MD  nystatin cream (MYCOSTATIN) Apply 1 application topically 3  (three) times daily as needed (rash). Apply to vaginal area for rash   Yes Historical Provider, MD  OVER THE COUNTER MEDICATION Apply 1 application topically 3 (three) times daily. Eucerin Cream: apply topically 3 times a day to dry skin on abdomen and other dry areas until healed.   Yes Historical Provider, MD  triamcinolone (KENALOG) 0.025 % cream Apply 1 application topically 2 (two) times daily as needed (itching feet). Apply to left and right feet   Yes Historical Provider, MD   BP 138/85 mmHg  Pulse 75  Temp(Src) 98.1 F (36.7 C) (Oral)  Resp 16  SpO2 100% Physical Exam  Constitutional: She appears well-developed and well-nourished.  Elderly 82 year old woman with contractures, nonverbal.  HENT:  Head: Normocephalic and atraumatic.  Unable to get HEENT exam due to patient noncompliance.  Neck: Neck supple.  Cardiovascular: Regular rhythm and normal heart sounds.   No murmur heard. Tachycardia  Pulmonary/Chest: Effort normal and breath sounds normal. She has no wheezes. She has no rales.  Abdominal: Soft. She exhibits no distension. There is no tenderness.  Musculoskeletal: Normal range of motion. She exhibits no edema.  Neurological:  Oriented 0  Skin: Skin is warm and dry. No rash noted. She is not diaphoretic.  Psychiatric:  Unable to assess secondary to advanced dementia.  Nursing note and vitals reviewed.   ED Course  Procedures (including critical care time) Labs Review Labs Reviewed  COMPREHENSIVE METABOLIC PANEL - Abnormal; Notable for the following:    Glucose, Bld 100 (*)  BUN 23 (*)    Creatinine, Ser 1.07 (*)    GFR calc non Af Amer 45 (*)    GFR calc Af Amer 53 (*)    All other components within normal limits  CBC WITH DIFFERENTIAL/PLATELET - Abnormal; Notable for the following:    WBC 11.6 (*)    Neutro Abs 9.2 (*)    All other components within normal limits  URINALYSIS, ROUTINE W REFLEX MICROSCOPIC (NOT AT [facility]) - Abnormal; Notable for the  following:    Hgb urine dipstick SMALL (*)    Leukocytes, UA TRACE (*)    All other components within normal limits  URINE MICROSCOPIC-ADD ON - Abnormal; Notable for the following:    Squamous Epithelial / LPF 0-5 (*)    All other components within normal limits  URINE CULTURE  I-STAT CG4 LACTIC ACID, ED  Rosezena Sensor, ED    Imaging Review Dg Chest 2 View  02/23/2015  CLINICAL DATA:  82 year old female with dementia and low-grade fever EXAM: CHEST  2 VIEW COMPARISON:  None. FINDINGS: Extremely limited chest radiograph secondary to left upper extremity contracture, and extreme a cervical thoracic kyphosis. The left upper extremity and the bony structures of the face partially obscure the lungs. Within these limitations, the heart is within normal limits for size. The aorta is highly tortuous and slightly ectatic. No definite focal airspace consolidation to suggest pneumonia. No edema, pleural effusion or pneumothorax. No acute osseous abnormality. IMPRESSION: Very limited chest radiographs secondary to patient related factors as described above. No acute cardiopulmonary process identified. Specifically, no evidence of pneumonia. Ectatic and tortuous thoracic aorta. Electronically Signed   By: Malachy Moan M.D.   On: 02/23/2015 15:40   I have personally reviewed and evaluated these images and lab results as part of my medical decision-making.   EKG Interpretation   Date/Time:  Friday February 23 2015 14:51:43 EST Ventricular Rate:  131 PR Interval:  68 QRS Duration: 141 QT Interval:  387 QTC Calculation: 571 R Axis:   -13 Text Interpretation:  Sinus tachycardia Ventricular premature complex  Anterior infarct, old Prolonged QT interval Sinus tachycardia Confirmed by  Kandis Mannan ([id number]) on 02/23/2015 3:31:53 PM     EKG Interpretation  Date/Time:  Friday February 23 2015 15:58:23 EST Ventricular Rate:  84 PR Interval:  183 QRS Duration: 172 QT Interval:  357 QTC  Calculation: 422 R Axis:   54 Text Interpretation:  Sinus rhythm Ventricular premature complex Aberrant conduction of SV complex(es) LVH with secondary repolarization abnormality Probable anterior infarct, age indeterminate no acute ischemia No significant change since last tracing Confirmed by Kandis Mannan ([id number]) on 02/23/2015 5:04:26 PM           MDM   Final diagnoses:  None   patient is 82 year old female with advanced dementia unable to indicate or participate in exam. On arrival here she has elevated heart rate. No fever. She had fever prior to arrival with congestion. We will assess for pneumonia, UTI. Give small amount of fluids.  Patient's tachycardia resolved after turning off the lights and calming in the room. She is on room air 100%. Appears baseline.  7:26 PM Patient's daughter here. Patient's vital signs have normalized. Chest x-ray is negative. Urine is indeterminate, will be sent for culture. Given patient is afebrile, at baseline we'll discharge back to dementia unit.  Skyy Mcknight Randall An, MD 02/23/15 9528

## 2015-02-25 LAB — URINE CULTURE: CULTURE: NO GROWTH

## 2015-04-01 ENCOUNTER — Encounter (HOSPITAL_COMMUNITY): Payer: Self-pay | Admitting: Emergency Medicine

## 2015-04-01 ENCOUNTER — Emergency Department (HOSPITAL_COMMUNITY): Payer: Medicare Other

## 2015-04-01 ENCOUNTER — Inpatient Hospital Stay (HOSPITAL_COMMUNITY)
Admission: EM | Admit: 2015-04-01 | Discharge: 2015-04-03 | DRG: 682 | Disposition: A | Discharge status: Nursing Home (Includes ICF) | Payer: Medicare Other | Attending: Internal Medicine | Admitting: Internal Medicine

## 2015-04-01 DIAGNOSIS — N179 Acute kidney failure, unspecified: Secondary | ICD-10-CM

## 2015-04-01 DIAGNOSIS — E162 Hypoglycemia, unspecified: Secondary | ICD-10-CM | POA: Diagnosis present

## 2015-04-01 DIAGNOSIS — G9340 Encephalopathy, unspecified: Secondary | ICD-10-CM | POA: Diagnosis not present

## 2015-04-01 DIAGNOSIS — Z8249 Family history of ischemic heart disease and other diseases of the circulatory system: Secondary | ICD-10-CM

## 2015-04-01 DIAGNOSIS — Z66 Do not resuscitate: Secondary | ICD-10-CM | POA: Diagnosis not present

## 2015-04-01 DIAGNOSIS — F0280 Dementia in other diseases classified elsewhere without behavioral disturbance: Secondary | ICD-10-CM | POA: Diagnosis present

## 2015-04-01 DIAGNOSIS — Z681 Body mass index (BMI) 19 or less, adult: Secondary | ICD-10-CM

## 2015-04-01 DIAGNOSIS — G309 Alzheimer's disease, unspecified: Secondary | ICD-10-CM | POA: Diagnosis present

## 2015-04-01 DIAGNOSIS — R4182 Altered mental status, unspecified: Secondary | ICD-10-CM | POA: Diagnosis not present

## 2015-04-01 DIAGNOSIS — E870 Hyperosmolality and hypernatremia: Secondary | ICD-10-CM | POA: Diagnosis not present

## 2015-04-01 DIAGNOSIS — L8990 Pressure ulcer of unspecified site, unspecified stage: Secondary | ICD-10-CM | POA: Insufficient documentation

## 2015-04-01 DIAGNOSIS — N390 Urinary tract infection, site not specified: Secondary | ICD-10-CM | POA: Diagnosis present

## 2015-04-01 DIAGNOSIS — E860 Dehydration: Secondary | ICD-10-CM

## 2015-04-01 DIAGNOSIS — R64 Cachexia: Secondary | ICD-10-CM | POA: Diagnosis present

## 2015-04-01 DIAGNOSIS — E878 Other disorders of electrolyte and fluid balance, not elsewhere classified: Secondary | ICD-10-CM | POA: Diagnosis present

## 2015-04-01 DIAGNOSIS — E43 Unspecified severe protein-calorie malnutrition: Secondary | ICD-10-CM | POA: Diagnosis present

## 2015-04-01 DIAGNOSIS — Z515 Encounter for palliative care: Secondary | ICD-10-CM

## 2015-04-01 DIAGNOSIS — Z79899 Other long term (current) drug therapy: Secondary | ICD-10-CM | POA: Diagnosis not present

## 2015-04-01 DIAGNOSIS — G308 Other Alzheimer's disease: Secondary | ICD-10-CM | POA: Diagnosis not present

## 2015-04-01 LAB — CBC WITH DIFFERENTIAL/PLATELET
BASOS PCT: 0 %
Basophils Absolute: 0 10*3/uL (ref 0.0–0.1)
EOS ABS: 0.1 10*3/uL (ref 0.0–0.7)
Eosinophils Relative: 1 %
HCT: 38.7 % (ref 36.0–46.0)
HEMOGLOBIN: 12.7 g/dL (ref 12.0–15.0)
Lymphocytes Relative: 15 %
Lymphs Abs: 2.9 10*3/uL (ref 0.7–4.0)
MCH: 26.2 pg (ref 26.0–34.0)
MCHC: 32.8 g/dL (ref 30.0–36.0)
MCV: 79.8 fL (ref 78.0–100.0)
MONOS PCT: 8 %
Monocytes Absolute: 1.5 10*3/uL — ABNORMAL HIGH (ref 0.1–1.0)
NEUTROS PCT: 76 %
Neutro Abs: 15.1 10*3/uL — ABNORMAL HIGH (ref 1.7–7.7)
PLATELETS: 232 10*3/uL (ref 150–400)
RBC: 4.85 MIL/uL (ref 3.87–5.11)
RDW: 15.5 % (ref 11.5–15.5)
WBC: 19.5 10*3/uL — AB (ref 4.0–10.5)

## 2015-04-01 LAB — URINALYSIS, ROUTINE W REFLEX MICROSCOPIC
BILIRUBIN URINE: NEGATIVE
GLUCOSE, UA: NEGATIVE mg/dL
KETONES UR: NEGATIVE mg/dL
Nitrite: POSITIVE — AB
PH: 7.5 (ref 5.0–8.0)
Protein, ur: 30 mg/dL — AB
Specific Gravity, Urine: 1.013 (ref 1.005–1.030)

## 2015-04-01 LAB — URINE MICROSCOPIC-ADD ON

## 2015-04-01 LAB — I-STAT TROPONIN, ED: TROPONIN I, POC: 0.05 ng/mL (ref 0.00–0.08)

## 2015-04-01 LAB — BASIC METABOLIC PANEL
BUN: 78 mg/dL — ABNORMAL HIGH (ref 6–20)
CO2: 28 mmol/L (ref 22–32)
CREATININE: 2 mg/dL — AB (ref 0.44–1.00)
Calcium: 9.9 mg/dL (ref 8.9–10.3)
GFR, EST AFRICAN AMERICAN: 25 mL/min — AB (ref >60)
GFR, EST NON AFRICAN AMERICAN: 21 mL/min — AB (ref >60)
Glucose, Bld: 121 mg/dL — ABNORMAL HIGH (ref 65–99)
Potassium: 4.3 mmol/L (ref 3.5–5.1)
SODIUM: 170 mmol/L — AB (ref 135–145)

## 2015-04-01 LAB — GLUCOSE, CAPILLARY: GLUCOSE-CAPILLARY: 101 mg/dL — AB (ref 65–99)

## 2015-04-01 LAB — CBG MONITORING, ED: Glucose-Capillary: 121 mg/dL — ABNORMAL HIGH (ref 65–99)

## 2015-04-01 MED ORDER — ONDANSETRON HCL 4 MG/2ML IJ SOLN: 4.0000 mg | Freq: Four times a day (QID) | INTRAMUSCULAR | Status: DC | PRN

## 2015-04-01 MED ORDER — ACETAMINOPHEN 650 MG RE SUPP: 650.0000 mg | Freq: Four times a day (QID) | RECTAL | Status: DC | PRN

## 2015-04-01 MED ORDER — HEPARIN SODIUM (PORCINE) 5000 UNIT/ML IJ SOLN
5000.0000 [IU] | Freq: Three times a day (TID) | INTRAMUSCULAR | Status: DC
  Administered 2015-04-02 – 2015-04-03 (×5): 5000 [IU] via SUBCUTANEOUS
  Filled 2015-04-01 (×6): qty 1

## 2015-04-01 MED ORDER — ACETAMINOPHEN 325 MG PO TABS: 650.0000 mg | ORAL_TABLET | Freq: Four times a day (QID) | ORAL | Status: DC | PRN

## 2015-04-01 MED ORDER — LORAZEPAM 2 MG/ML IJ SOLN
0.5000 mg | Freq: Once | INTRAMUSCULAR | Status: AC
  Administered 2015-04-01: 0.5 mg via INTRAVENOUS
  Filled 2015-04-01: qty 1

## 2015-04-01 MED ORDER — DEXTROSE 5 % IV SOLN
INTRAVENOUS | Status: DC
  Administered 2015-04-01: 20:00:00 via INTRAVENOUS

## 2015-04-01 MED ORDER — ONDANSETRON HCL 4 MG PO TABS: 4.0000 mg | ORAL_TABLET | Freq: Four times a day (QID) | ORAL | Status: DC | PRN

## 2015-04-01 MED ORDER — DEXTROSE 5 % IV SOLN
INTRAVENOUS | Status: AC
  Administered 2015-04-02: 17:00:00 via INTRAVENOUS

## 2015-04-01 NOTE — ED Provider Notes (Signed)
CSN: [id number]     Arrival date & time 04/01/15  1722 History   First MD Initiated Contact with Patient 04/01/15 1743     Chief Complaint  Patient presents with  . Hypoglycemia  . URI     (Consider location/radiation/quality/duration/timing/severity/associated sxs/prior Treatment) Patient is a 82 y.o. female presenting with hypoglycemia and URI. The history is provided by the patient and medical records.  Hypoglycemia URI   LEVEL 5 CAVEAT:  DEMENTIA  82 year old female with history of dementia, presenting to the ED for altered mental status. [facility] History provided by daughter at bedside.  Per staff patient has had some nasal congestion for the past few days as well as decreased appetite. They report she has been drinking small amount of fluid that has had very little solid food intake. They also report her functional status has been declining--  she is not talking as much or able to walk at her baseline which is usually 1 person assist.   Daughter states at baseline she is usually fairly talkative, has somewhat of a "potty mouth". Today staff checked her blood sugar and it was found to be 52. She was given IM glucagon and by time of EMS arrival he had improved to 138. Patient has no history of diabetes or other ongoing medical problems besides dementia.  No reported fever from staff.  No vomiting or diarrhea.  No hx fo UTI.  VSS.  Past Medical History  Diagnosis Date  . Dementia    Past Surgical History  Procedure Laterality Date  . Abdominal hysterectomy     History reviewed. No pertinent family history. Social History  Substance Use Topics  . Smoking status: Never Smoker   . Smokeless tobacco: Never Used  . Alcohol Use: No   OB History    No data available     Review of Systems  Unable to perform ROS: Dementia      Allergies  Review of patient's allergies indicates no known allergies.  Home Medications   Prior to Admission  medications   Medication Sig Start Date End Date Taking? Authorizing Provider  Chloroxylenol-Zinc Oxide (BAZA EX) Apply 1 application topically 2 (two) times daily. Apply to buttocks twice daily after each incontinence episode    Historical Provider, MD  Cholecalciferol (VITAMIN D-3) 1000 UNITS CAPS Take 1,000 Units by mouth daily.     Historical Provider, MD  dextromethorphan-guaiFENesin (MUCINEX DM) 30-600 MG 12hr tablet Take 1 tablet by mouth 2 (two) times daily. 02/23/15   Courteney Lyn Mackuen, MD  memantine (NAMENDA) 5 MG tablet Take 5 mg by mouth 2 (two) times daily.    Historical Provider, MD  NUTRITIONAL SUPPLEMENT LIQD Take 1 Bottle by mouth 3 (three) times daily. Mighty Shake    Historical Provider, MD  nystatin cream (MYCOSTATIN) Apply 1 application topically 3 (three) times daily as needed (rash). Apply to vaginal area for rash    Historical Provider, MD  OVER THE COUNTER MEDICATION Apply 1 application topically 3 (three) times daily. Eucerin Cream: apply topically 3 times a day to dry skin on abdomen and other dry areas until healed.    Historical Provider, MD  triamcinolone (KENALOG) 0.025 % cream Apply 1 application topically 2 (two) times daily as needed (itching feet). Apply to left and right feet    Historical Provider, MD   BP 134/104 mmHg  Pulse 81  Temp(Src) 97.5 F (36.4 C) (Axillary)  Resp 18  SpO2 95%   Physical  Exam  Constitutional: She appears well-developed and well-nourished. No distress.  Elderly, thin  HENT:  Head: Normocephalic and atraumatic.  Right Ear: Tympanic membrane and ear canal normal.  Left Ear: Tympanic membrane and ear canal normal.  Nose: Mucosal edema (dried, crusted) present.  Mouth/Throat: Uvula is midline, oropharynx is clear and moist and mucous membranes are normal. No oropharyngeal exudate, posterior oropharyngeal edema, posterior oropharyngeal erythema or tonsillar abscesses.  Eyes: Conjunctivae and EOM are normal. Pupils are equal,  round, and reactive to light.  Neck: Normal range of motion. Neck supple.  Cardiovascular: Normal rate, regular rhythm and normal heart sounds.   Pulmonary/Chest: Effort normal and breath sounds normal. No respiratory distress. She has no wheezes.  Abdominal: Soft. Bowel sounds are normal. There is no tenderness. There is no guarding.  Musculoskeletal: Normal range of motion.  Neurological: She is alert.  Awake,  Response to verbal stimuli with one or 2 word answers with eyes remaining closed, moving all extremities spontaneously but mostly laying with knees curled up to chest  Skin: Skin is warm and dry. She is not diaphoretic.  Psychiatric: She has a normal mood and affect.  Nursing note and vitals reviewed.   ED Course  Procedures (including critical care time)  CRITICAL CARE Performed by: Garlon Hatchet   Total critical care time: 35 minutes  Critical care time was exclusive of separately billable procedures and treating other patients.  Critical care was necessary to treat or prevent imminent or life-threatening deterioration.  Critical care was time spent personally by me on the following activities: development of treatment plan with patient and/or surrogate as well as nursing, discussions with consultants, evaluation of patient's response to treatment, examination of patient, obtaining history from patient or surrogate, ordering and performing treatments and interventions, ordering and review of laboratory studies, ordering and review of radiographic studies, pulse oximetry and re-evaluation of patient's condition.  Medications  dextrose 5 % solution ( Intravenous New Bag/Given 04/01/15 2008)  LORazepam (ATIVAN) injection 0.5 mg (0.5 mg Intravenous Given 04/01/15 1908)   Labs Review Labs Reviewed  CBC WITH DIFFERENTIAL/PLATELET - Abnormal; Notable for the following:    WBC 19.5 (*)    Neutro Abs 15.1 (*)    Monocytes Absolute 1.5 (*)    All other components within normal  limits  BASIC METABOLIC PANEL - Abnormal; Notable for the following:    Sodium 170 (*)    Chloride >130 (*)    Glucose, Bld 121 (*)    BUN 78 (*)    Creatinine, Ser 2.00 (*)    GFR calc non Af Amer 21 (*)    GFR calc Af Amer 25 (*)    All other components within normal limits  CBG MONITORING, ED - Abnormal; Notable for the following:    Glucose-Capillary 121 (*)    All other components within normal limits  URINE CULTURE  URINALYSIS, ROUTINE W REFLEX MICROSCOPIC (NOT AT [facility])  Rosezena Sensor, ED    Imaging Review Dg Chest 2 View  04/01/2015  CLINICAL DATA:  URI symptoms, congestion, decreased appetite. EXAM: CHEST  2 VIEW COMPARISON:  02/23/2015 FINDINGS: Lungs are clear.  No pleural effusion or pneumothorax. The heart is normal size. Degenerative changes of the visualized thoracolumbar spine. IMPRESSION: No evidence of acute cardiopulmonary disease. Electronically Signed   By: Charline Bills M.D.   On: 04/01/2015 18:28   Ct Head Wo Contrast  04/01/2015  CLINICAL DATA:  Altered mental status EXAM: CT HEAD WITHOUT CONTRAST TECHNIQUE: Contiguous  axial images were obtained from the base of the skull through the vertex without intravenous contrast. COMPARISON:  None. FINDINGS: Bony calvarium is intact. Diffuse atrophic changes and chronic white matter ischemic changes are seen. No findings to suggest acute hemorrhage, acute infarction or space-occupying mass lesion are noted. IMPRESSION: Chronic atrophic and ischemic changes without acute abnormality. Electronically Signed   By: Alcide Clever M.D.   On: 04/01/2015 19:22   I have personally reviewed and evaluated these images and lab results as part of my medical decision-making.   EKG Interpretation None      MDM   Final diagnoses:  Hypernatremia  Hyperchloremia  Dehydration  AKI (acute kidney injury) (HCC)   82 y.o. F here with change in mental status.  Facility reports URI symptoms and poor PO intake for the past few days.   Patient afebrile, non-toxic.  Family reports patient is not currently at her baseline.  She is usually active, talkative, and ambulatory.  Patient is lying in bed, knees curled up to chest.  She will mumble 1-2 words answers when spoken to but keeps her eyes closed.  VSS.  CXR and CT head WNL.  Labs with critical sodium and chloride at 170 and >130 respectively.  This is likely the source of patient's symptoms and abrupt change in mental status.  Will start on D5W drip.  Patient will be admitted to step-down unit by Dr. Toniann Fail of hospitalist service.  Family updated on plan of care, they acknowledged understanding.  Garlon Hatchet, PA-C 04/01/15 2154  Lorre Nick, MD 04/01/15 [phone]

## 2015-04-01 NOTE — ED Notes (Signed)
Per MD patient to go to Med Surg. Paging physician for bed change

## 2015-04-01 NOTE — ED Notes (Signed)
Critical Sodium 170 Chloride >130

## 2015-04-01 NOTE — H&P (Signed)
[facility] History and Physical  Shelley Fowler WUJ:[id number] DOB: 1925/08/26 DOA: 04/01/2015  Referring physician: Ms. Misty Stanley. PCP: [name], MD  Specialists: None.  History obtained from patient's daughter and ER PA.  Chief Complaint: Altered mental status.  HPI: Shelley Fowler is a 82 y.o. female with history of advanced dementia was brought to the ER after patient was found to be increasingly lethargic and not herself. Patient also was found to not be eating well over the last 3-4 days. As per patient's daughter patient did not have any nausea vomiting or diarrhea. Few weeks ago patient did have some chest congestion. Patient usually is ambulatory as per the patient's daughter. Over the last 3-4 days patient has become bedridden and hardly responsive. In the ER CT head did not show any acute but labs reveal severe hypernatremia. Creatinine also is increased from baseline. On exam patient is minimally responsive. Patient will be admitted for further hydration. Patient does have leukocytosis but at this time is afebrile and there is no definite source for infection.   Review of Systems: As presented in the history of presenting illness, rest negative.  Past Medical History  Diagnosis Date  . Dementia    Past Surgical History  Procedure Laterality Date  . Abdominal hysterectomy     Social History:  reports that she has never smoked. She has never used smokeless tobacco. She reports that she does not drink alcohol or use illicit drugs. Where does patient live At assisted living facility. Can patient participate in ADLs? Not sure.  No Known Allergies  Family History:  Family History  Problem Relation Age of Onset  . Hypertension Other       Prior to Admission medications   Medication Sig Start Date End Date Taking? Authorizing Provider  Chloroxylenol-Zinc Oxide (BAZA EX) Apply 1 application topically 2 (two) times daily. Apply to buttocks twice daily after each  incontinence episode   Yes Historical Provider, MD  Cholecalciferol (VITAMIN D-3) 1000 UNITS CAPS Take 1,000 Units by mouth daily.    Yes Historical Provider, MD  Emollient (CERAVE) CREA Apply 1 application topically 3 (three) times daily. Apply to bilateral lower extremities   Yes Historical Provider, MD  memantine (NAMENDA) 5 MG tablet Take 5 mg by mouth 2 (two) times daily.   Yes Historical Provider, MD  NUTRITIONAL SUPPLEMENT LIQD Take 1 Bottle by mouth 3 (three) times daily. *Mighty Shake*   Yes Historical Provider, MD  nystatin cream (MYCOSTATIN) Apply 1 application topically 3 (three) times daily as needed (rash). Apply to vaginal area for rash   Yes Historical Provider, MD  triamcinolone (KENALOG) 0.025 % cream Apply 1 application topically 2 (two) times daily as needed (itching feet). Apply to left and right feet   Yes Historical Provider, MD  dextromethorphan-guaiFENesin (MUCINEX DM) 30-600 MG 12hr tablet Take 1 tablet by mouth 2 (two) times daily. Patient not taking: Reported on 04/01/2015 02/23/15   Courteney Lyn Mackuen, MD    Physical Exam: Filed Vitals:   04/01/15 1745 04/01/15 2122  BP: 134/104 92/50  Pulse: 81 70  Temp: 97.5 F (36.4 C)   TempSrc: Axillary   Resp: 18 16  SpO2: 95% 96%     General:  Moderately built and poorly nourished.  Eyes: Anicteric no pallor.  ENT: No discharge from the ears eyes nose and mouth.  Neck: No mass felt.  Cardiovascular: S1-S2 heard.  Respiratory: No rhonchi or crepitations.  Abdomen: Soft nontender bowel sounds present.  Skin: No  rash.  Musculoskeletal: No edema.  Psychiatric: Patient is hardly responsive.  Neurologic: Patient is unresponsive.  Labs on Admission:  Basic Metabolic Panel:  Recent Labs Lab 04/01/15 1859  NA 170*  K 4.3  CL >130*  CO2 28  GLUCOSE 121*  BUN 78*  CREATININE 2.00*  CALCIUM 9.9   Liver Function Tests: No results for input(s): AST, ALT, ALKPHOS, BILITOT, PROT, ALBUMIN in the last  168 hours. No results for input(s): LIPASE, AMYLASE in the last 168 hours. No results for input(s): AMMONIA in the last 168 hours. CBC:  Recent Labs Lab 04/01/15 1859  WBC 19.5*  NEUTROABS 15.1*  HGB 12.7  HCT 38.7  MCV 79.8  PLT 232   Cardiac Enzymes: No results for input(s): CKTOTAL, CKMB, CKMBINDEX, TROPONINI in the last 168 hours.  BNP (last 3 results) No results for input(s): BNP in the last 8760 hours.  ProBNP (last 3 results) No results for input(s): PROBNP in the last 8760 hours.  CBG:  Recent Labs Lab 04/01/15 1742  GLUCAP 121*    Radiological Exams on Admission: Dg Chest 2 View  04/01/2015  CLINICAL DATA:  URI symptoms, congestion, decreased appetite. EXAM: CHEST  2 VIEW COMPARISON:  02/23/2015 FINDINGS: Lungs are clear.  No pleural effusion or pneumothorax. The heart is normal size. Degenerative changes of the visualized thoracolumbar spine. IMPRESSION: No evidence of acute cardiopulmonary disease. Electronically Signed   By: Charline BillsSriyesh  Krishnan M.D.   On: 04/01/2015 18:28   Ct Head Wo Contrast  04/01/2015  CLINICAL DATA:  Altered mental status EXAM: CT HEAD WITHOUT CONTRAST TECHNIQUE: Contiguous axial images were obtained from the base of the skull through the vertex without intravenous contrast. COMPARISON:  None. FINDINGS: Bony calvarium is intact. Diffuse atrophic changes and chronic white matter ischemic changes are seen. No findings to suggest acute hemorrhage, acute infarction or space-occupying mass lesion are noted. IMPRESSION: Chronic atrophic and ischemic changes without acute abnormality. Electronically Signed   By: Alcide CleverMark  Lukens M.D.   On: 04/01/2015 19:22     Assessment/Plan Principal Problem:   Acute encephalopathy Active Problems:   Alzheimer's disease   Hypernatremia   1. Acute encephalopathy secondary to severe hypernatremia - probably from severe dehydration and poor oral intake. Patient has been placed on D5W. Positive for metabolic panel.  I had extensive discussion with patient's daughter. If patient's condition does not improve at present interventions then patient will be made comfort measures only. 2. Acute renal failure probably from dehydration - continue with hydration closely for metabolic panel. 3. Leukocytosis - probably from dehydration. There is no source of infection. Patient is afebrile. Although CBC. 4. History of dementia preceding not taking any medications as per patient's daughter. 5. Severe protein calorie malnutrition.   DVT Prophylaxis heparin.  Code Status: DO NOT RESUSCITATE.  Family Communication: Patient's daughter.  Disposition Plan: Admit to inpatient.    Dacotah Cabello N. [facility] Pager [phone].  If 7PM-7AM, please contact night-coverage [email or web address] Password [email or web address] 04/01/2015, 9:32 PM

## 2015-04-01 NOTE — ED Notes (Signed)
REPORT WILL BE GIVEN TO THE FLOOR 4E 1414-1. PT CONFUSED AND IN NO APPARENT DISTRESS OR PAIN. IVF OF D5% INFUSING W/O PAIN OR SWELLING.

## 2015-04-01 NOTE — ED Notes (Addendum)
Patient urine recollection needed, insufficient amount in sample to complete testing.   Primary nurse notified

## 2015-04-01 NOTE — ED Notes (Addendum)
Per EMS. [facility]. Hx dementia, oriented per norm. Staff told EMS pt had been having URI symptoms, congestion and decreased appetite for the past 3 days. Staff checked CBG and found she had a CBG of 52. Facility gave IM glucagon. By EMS arrival CBG was 138. No known hx of diabetes.

## 2015-04-01 NOTE — ED Notes (Signed)
Bed: [id number] Expected date:  Expected time:  Means of arrival:  Comments: 82 yo hypoglycemic

## 2015-04-02 DIAGNOSIS — E43 Unspecified severe protein-calorie malnutrition: Secondary | ICD-10-CM

## 2015-04-02 DIAGNOSIS — L8990 Pressure ulcer of unspecified site, unspecified stage: Secondary | ICD-10-CM

## 2015-04-02 DIAGNOSIS — G309 Alzheimer's disease, unspecified: Secondary | ICD-10-CM

## 2015-04-02 LAB — CBC
HEMATOCRIT: 36.1 % (ref 36.0–46.0)
Hemoglobin: 11.6 g/dL — ABNORMAL LOW (ref 12.0–15.0)
MCH: 26.1 pg (ref 26.0–34.0)
MCHC: 32.1 g/dL (ref 30.0–36.0)
MCV: 81.3 fL (ref 78.0–100.0)
Platelets: 222 10*3/uL (ref 150–400)
RBC: 4.44 MIL/uL (ref 3.87–5.11)
RDW: 15.5 % (ref 11.5–15.5)
WBC: 11.4 10*3/uL — ABNORMAL HIGH (ref 4.0–10.5)

## 2015-04-02 LAB — BASIC METABOLIC PANEL
BUN: 78 mg/dL — AB (ref 6–20)
BUN: 66 mg/dL — AB (ref 6–20)
CO2: 24 mmol/L (ref 22–32)
CO2: 24 mmol/L (ref 22–32)
Calcium: 9.2 mg/dL (ref 8.9–10.3)
Calcium: 9.2 mg/dL (ref 8.9–10.3)
Chloride: >130 mmol/L (ref 101–111)
Creatinine, Ser: 1.86 mg/dL — ABNORMAL HIGH (ref 0.44–1.00)
Creatinine, Ser: 1.73 mg/dL — ABNORMAL HIGH (ref 0.44–1.00)
GFR calc Af Amer: 27 mL/min — ABNORMAL LOW (ref >60)
GFR calc Af Amer: 29 mL/min — ABNORMAL LOW (ref >60)
GFR calc non Af Amer: 25 mL/min — ABNORMAL LOW (ref >60)
GFR, EST NON AFRICAN AMERICAN: 23 mL/min — AB (ref >60)
GLUCOSE: 128 mg/dL — AB (ref 65–99)
GLUCOSE: 127 mg/dL — AB (ref 65–99)
POTASSIUM: 3.9 mmol/L (ref 3.5–5.1)
POTASSIUM: 4.1 mmol/L (ref 3.5–5.1)
Sodium: 168 mmol/L (ref 135–145)
Sodium: 164 mmol/L (ref 135–145)

## 2015-04-02 LAB — GLUCOSE, CAPILLARY
GLUCOSE-CAPILLARY: 133 mg/dL — AB (ref 65–99)
Glucose-Capillary: 103 mg/dL — ABNORMAL HIGH (ref 65–99)

## 2015-04-02 LAB — MRSA PCR SCREENING: MRSA by PCR: NEGATIVE

## 2015-04-02 MED ORDER — AMOXICILLIN 250 MG/5ML PO SUSR
250.0000 mg | Freq: Once | ORAL | Status: AC
  Administered 2015-04-02: 250 mg via ORAL
  Filled 2015-04-02: qty 5

## 2015-04-02 MED ORDER — PRO-STAT SUGAR FREE PO LIQD
30.0000 mL | Freq: Every day | ORAL | Status: DC
  Administered 2015-04-02 – 2015-04-03 (×2): 30 mL via ORAL
  Filled 2015-04-02 (×2): qty 30

## 2015-04-02 MED ORDER — VITAMINS A & D EX OINT
TOPICAL_OINTMENT | CUTANEOUS | Status: AC
  Administered 2015-04-02: 5
  Filled 2015-04-02: qty 5

## 2015-04-02 MED ORDER — BOOST / RESOURCE BREEZE PO LIQD
1.0000 | Freq: Two times a day (BID) | ORAL | Status: DC
  Administered 2015-04-02 – 2015-04-03 (×3): 1 via ORAL

## 2015-04-02 MED ORDER — AMOXICILLIN 250 MG/5ML PO SUSR
250.0000 mg | Freq: Two times a day (BID) | ORAL | Status: DC
  Administered 2015-04-02 – 2015-04-03 (×2): 250 mg via ORAL
  Filled 2015-04-02 (×2): qty 5

## 2015-04-02 NOTE — Progress Notes (Signed)
Initial Nutrition Assessment  DOCUMENTATION CODES:   Severe malnutrition in context of chronic illness, Underweight  INTERVENTION:  - Will order 30 mL Prostat once/day, this supplement provides 100 kcal and 15 grams of protein. - Boost Breeze BID, each supplement provides 250 kcal and 9 grams of protein - Tech/RN to assist during meal times and provide encouragement with meals and supplements - RD will continue to monitor for needs  NUTRITION DIAGNOSIS:   Inadequate oral intake related to lethargy/confusion, poor appetite as evidenced by per patient/family report.  GOAL:   Patient will meet greater than or equal to 90% of their needs  MONITOR:   PO intake, Supplement acceptance, Weight trends, Labs, Skin, I & O's  REASON FOR ASSESSMENT:   Other (Comment) (Underweight BMI)  ASSESSMENT:   82 y.o. female with history of advanced dementia was brought to the ER after patient was found to be increasingly lethargic and not herself. Patient also was found to not be eating well over the last 3-4 days. As per patient's daughter patient did not have any nausea vomiting or diarrhea. Few weeks ago patient did have some chest congestion. Patient usually is ambulatory as per the patient's daughter. Over the last 3-4 days patient has become bedridden and hardly responsive. In the ER CT head did not show any acute but labs reveal severe hypernatremia. Creatinine also is increased from baseline. On exam patient is minimally responsive. Patient will be admitted for further hydration. Patient does have leukocytosis but at this time is afebrile and there is no definite source for infection.   Pt seen for underweight BMI. No intakes documented since admission and diet order states not to start diet (Regular, thin liquids) until pt is alert and awake. SLP has not seen pt so will monitor if current diet order remains or changes once pt able to be fed. Pt with hx of dementia and inability to provide  information; no family/visitors at bedside at time of RD visit.   Physical assessment not performed due to pt's inability to give consent and slightly agitated, moving around in bed, at time of visit. Able to visualized severe muscle and fat wasting to both upper and lower body. No recent weight available in the chart (last weight prior to this admission was 12/02/12 when pt weighed 114 lbs indicating 30 lb weight loss [26% body weight] since that time).  Pt not meeting needs. Will order supplements as outlined above and continue to monitor for additional needs and adjustments throughout admission. Medications reviewed. IVF: D5 @ 75 mL/hr (306 kcal/day). Labs reviewed; Na: 168 mmol/L, Cl: >130 mmol/L, BUN/creatinine elevated, GFR: 27.   Diet Order:  Diet regular Room service appropriate?: Yes; Fluid consistency:: Thin  Skin:  Wound (see comment) (Stage 2 sacral and stage 1 L hip pressure injuries)  Last BM:  PTA  Height:   Ht Readings from Last 1 Encounters:  04/01/15 5\' 4"  (1.626 m)    Weight:   Wt Readings from Last 1 Encounters:  04/01/15 84 lb 14 oz (38.5 kg)    Ideal Body Weight:  54.54 kg (kg)  BMI:  Body mass index is 14.56 kg/(m^2).  Estimated Nutritional Needs:   Kcal:  1150-1350 (30-35 kcal/kg)  Protein:  58-65 grams (1.5-1.7 grams/kg)  Fluid:  >/= 1.5 L/day  EDUCATION NEEDS:   No education needs identified at this time    Trenton GammonJessica Mitsue Peery, RD, LDN Inpatient Clinical Dietitian Pager # [phone] After hours/weekend pager # [phone]

## 2015-04-02 NOTE — Progress Notes (Signed)
Pt is unable to fully answer these questions at this time

## 2015-04-02 NOTE — Progress Notes (Signed)
[facility] Progress Note   Shelley Fowler  ZOX:[id number]  DOB: 10/26/1925  DOA: 04/01/2015 PCP: [name], MD  Brief narrative: Shelley Fowler is a 82 y.o. female female who resides at a nursing home with history of advanced dementia was brought to the ER after patient was found to be increasingly lethargic. CBG was 52. Bmet shows severe dehydration, hypernatermia, hyperchrolremia.    Subjective: nonverbal  Assessment/Plan: Principal Problem:   Acute encephalopathy  - due to electrolyte derangements and possibly UTI  Active Problems: Severe hypernatremia/ hyperchloremia due to dehydration/   Protein-calorie malnutrition, severe  - cont IVF- poor PO intake for > 1 month- cachectic on exam- I have asked for a palliative care consult  UTI - amoxil- f/u cultures    Alzheimer's disease with severe dementia - doubt Namenda is helpful at this stage- will hold    Pressure ulcer - wound care  Antibiotics: Anti-infectives    None     Code Status:     Code Status Orders        Start     Ordered   04/01/15 2341  Do not attempt resuscitation (DNR)   Continuous    Question Answer Comment  In the event of cardiac or respiratory ARREST Do not call a "code blue"   In the event of cardiac or respiratory ARREST Do not perform Intubation, CPR, defibrillation or ACLS   In the event of cardiac or respiratory ARREST Use medication by any route, position, wound care, and other measures to relive pain and suffering. May use oxygen, suction and manual treatment of airway obstruction as needed for comfort.      04/01/15 2340    Code Status History    Date Active Date Inactive Code Status Order ID Comments User Context   07/22/2012  3:04 PM 07/24/2012  6:00 PM DNR [id number]  Laveda Norman, MD Inpatient     Family Communication:   Disposition Plan:  DVT prophylaxis: Heparin Consultants: palliative care Procedures:     Objective: Filed Weights   04/01/15 2325  Weight: 38.5  kg (84 lb 14 oz)    Intake/Output Summary (Last 24 hours) at 04/02/15 1504 Last data filed at 04/02/15 0802  Gross per 24 hour  Intake  567.5 ml  Output      0 ml  Net  567.5 ml     Vitals Filed Vitals:   04/01/15 2325 04/02/15 0006 04/02/15 0608 04/02/15 1403  BP: 127/74  106/82 95/80  Pulse: 84  84 64  Temp: 96.8 F (36 C) 98.1 F (36.7 C) 97.1 F (36.2 C) 97.7 F (36.5 C)  TempSrc: Rectal Rectal Rectal Axillary  Resp: Height:  (1.626 m)     Weight: 38.5 kg (84 lb 14 oz)     SpO2: 93%       Exam:  General:  Pt is alert, non-verbal, not in acute distress  HEENT: No icterus, No thrush, oral mucosa moist  Cardiovascular: regular rate and rhythm, S1/S2 No murmur  Respiratory: clear to auscultation bilaterally   Abdomen: Soft, +Bowel sounds, non tender, non distended, no guarding  MSK: No cyanosis or clubbing- no pedal edema   Data Reviewed: Basic Metabolic Panel:  Recent Labs Lab 04/01/15 1859 04/02/15 0201 04/02/15 0806 04/02/15 1410  NA 170* 168* 164* 161*  K 4.3 3.9 4.1 3.9  CL >130* >130* >130* 128*  CO2 GLUCOSE 121* 128* 127* 101*  BUN 78* 78* 66* 61*  CREATININE 2.00* 1.86* 1.73* 1.57*  CALCIUM 9.9 9.2 9.2 9.1   Liver Function Tests: No results for input(s): AST, ALT, ALKPHOS, BILITOT, PROT, ALBUMIN in the last 168 hours. No results for input(s): LIPASE, AMYLASE in the last 168 hours. No results for input(s): AMMONIA in the last 168 hours. CBC:  Recent Labs Lab 04/01/15 1859 04/02/15 0201  WBC 19.5* 11.4*  NEUTROABS 15.1*  --   HGB 12.7 11.6*  HCT 38.7 36.1  MCV 79.8 81.3  PLT 232 222   Cardiac Enzymes: No results for input(s): CKTOTAL, CKMB, CKMBINDEX, TROPONINI in the last 168 hours. BNP (last 3 results) No results for input(s): BNP in the last 8760 hours.  ProBNP (last 3 results) No results for input(s): PROBNP in the last 8760 hours.  CBG:  Recent Labs Lab 04/01/15 1742 04/01/15 2308  04/02/15 0601  GLUCAP 121* 101* 103*    Recent Results (from the past 240 hour(s))  MRSA PCR Screening     Status: None   Collection Time: 04/01/15 11:46 PM  Result Value Ref Range Status   MRSA by PCR NEGATIVE NEGATIVE Final    Comment:        The GeneXpert MRSA Assay (FDA approved for NASAL specimens only), is one component of a comprehensive MRSA colonization surveillance program. It is not intended to diagnose MRSA infection nor to guide or monitor treatment for MRSA infections.      Studies: Dg Chest 2 View  04/01/2015  CLINICAL DATA:  URI symptoms, congestion, decreased appetite. EXAM: CHEST  2 VIEW COMPARISON:  02/23/2015 FINDINGS: Lungs are clear.  No pleural effusion or pneumothorax. The heart is normal size. Degenerative changes of the visualized thoracolumbar spine. IMPRESSION: No evidence of acute cardiopulmonary disease. Electronically Signed   By: Charline BillsSriyesh  Krishnan M.D.   On: 04/01/2015 18:28   Ct Head Wo Contrast  04/01/2015  CLINICAL DATA:  Altered mental status EXAM: CT HEAD WITHOUT CONTRAST TECHNIQUE: Contiguous axial images were obtained from the base of the skull through the vertex without intravenous contrast. COMPARISON:  None. FINDINGS: Bony calvarium is intact. Diffuse atrophic changes and chronic white matter ischemic changes are seen. No findings to suggest acute hemorrhage, acute infarction or space-occupying mass lesion are noted. IMPRESSION: Chronic atrophic and ischemic changes without acute abnormality. Electronically Signed   By: Alcide CleverMark  Lukens M.D.   On: 04/01/2015 19:22    Scheduled Meds:  Scheduled Meds: . feeding supplement  1 Container Oral BID BM  . feeding supplement (PRO-STAT SUGAR FREE 64)  30 mL Oral Daily  . heparin  5,000 Units Subcutaneous 3 times per day   Continuous Infusions: . dextrose 75 mL (04/02/15 0002)    Time spent on care of this patient: 35 min   Lawerance Matsuo, MD 04/02/2015, 3:04 PM  LOS: 1 day   [facility] Office  [phone] Pager - Text Page per [email or web address] If 7PM-7AM, please contact night-coverage [email or web address]

## 2015-04-02 NOTE — Care Management Note (Signed)
Case Management Note  Patient Details  Name: Meredith Leedsnn J Bakula MRN: [id number] Date of Birth: [birth year]  Subjective/Objective:  82 y/o f admitted w/AMS. [facility]NR.[facility]. Pt cons-await recc.                  Action/Plan:d/c plan ALF.   Expected Discharge Date:                Expected Discharge Plan:  Assisted Living / Rest Home  In-House Referral:  Clinical Social Work  Discharge planning Services  CM Consult  Post Acute Care Choice:    Choice offered to:     DME Arranged:    DME Agency:     HH Arranged:    HH Agency:     Status of Service:  In process, will continue to follow  Medicare Important Message Given:    Date Medicare IM Given:    Medicare IM give by:    Date Additional Medicare IM Given:    Additional Medicare Important Message give by:     If discussed at Long Length of Stay Meetings, dates discussed:    Additional Comments:  Lanier ClamMahabir, Jaydan Meidinger, RN 04/02/2015, 3:03 PM

## 2015-04-02 NOTE — Progress Notes (Signed)
Lab reports NA+ is now 168 (was 170) and Chloride is still greater than 130.  [facility] on call notified.

## 2015-04-02 NOTE — Evaluation (Signed)
Physical Therapy Evaluation Patient Details Name: Shelley Fowler MRN: [id number] DOB: [birth year] Today's Date: 04/02/2015   History of Present Illness  Shelley Fowler is a 82 y.o. female  history of advanced dementia was brought to the ER after patient was found to be increasingly lethargic. CBG was 52. Bmet shows severe dehydration, hypernatermia, hyperchrolremia.   Clinical Impression  Patient evaluated by Physical Therapy with no further acute PT needs identified. All education has been completed and the patient has no further questions.  See below for any follow-up Physical Therapy or equipment needs. PT is signing off. Thank you for this referral. Pt with diffuse muscle atrophy throughout, pt dtr reports she hasn't been able to wake her to get her to eat; Pt extremely lethargic on PT arrival  but much more alert with imposed mobility and verbal/tactile stimuli; pt agreeable to get to chair and able to initiate standing but requiring +2  For transfer to chair; pt alert and  eating (with dtr feeding her) at end of session, RN made aware     Follow Up Recommendations SNF (if pt progresses or ALF able to provide incr assist--rec HHPT)    Equipment Recommendations  None recommended by PT    Recommendations for Other Services       Precautions / Restrictions Precautions Precautions: Fall      Mobility  Bed Mobility Overal bed mobility: Needs Assistance;+2 for physical assistance Bed Mobility: Supine to Sit     Supine to sit: Mod assist     General bed mobility comments: pt dtr assisting with +2; pt requires assist for LEs and trunk, mostly d/t lethargy--incr mobility in attempt to wake pt; pt was in fetal position in bed on arrival  Transfers Overall transfer level: Needs assistance Equipment used: None Transfers: Sit to/from Stand;Stand Pivot Transfers Sit to Stand: +2 physical assistance;Max assist Stand pivot transfers: Max assist;+2 physical assistance       General  transfer comment: multi-modal cues for particpation; pt initiates standing but is unable to fully extend trunk and LEs, posterior bias in standing and requiring +2 to prevent fall and pivot to chair  Ambulation/Gait                Stairs            Wheelchair Mobility    Modified Rankin (Stroke Patients Only)       Balance Overall balance assessment: Needs assistance Sitting-balance support: Feet supported;No upper extremity supported Sitting balance-Leahy Scale: Fair Sitting balance - Comments: pt balance poor initially but improved with time, pt able to support self   Standing balance support: During functional activity;No upper extremity supported Standing balance-Leahy Scale: Zero                               Pertinent Vitals/Pain Pain Assessment: Faces Faces Pain Scale: No hurt    Home Living Family/patient expects to be discharged to:: Assisted living               Home Equipment: None Additional Comments: From [facility])    Prior Function Level of Independence: Needs assistance   Gait / Transfers Assistance Needed: pt ambulatory without AD prior to adm per pt dtr  ADL's / Homemaking Assistance Needed: assistance        Hand Dominance        Extremity/Trunk Assessment   Upper Extremity Assessment: Generalized weakness  Lower Extremity Assessment: Generalized weakness         Communication      Cognition Arousal/Alertness: Lethargic Behavior During Therapy: Restless Overall Cognitive Status: History of cognitive impairments - at baseline                      General Comments      Exercises        Assessment/Plan    PT Assessment Patient needs continued PT services  PT Diagnosis Difficulty walking;Generalized weakness   PT Problem List Decreased strength;Decreased activity tolerance;Decreased mobility  PT Treatment Interventions DME instruction;Gait  training;Functional mobility training;Therapeutic activities;Therapeutic exercise;Patient/family education   PT Goals (Current goals can be found in the Care Plan section) Acute Rehab PT Goals Patient Stated Goal: unable to state PT Goal Formulation: Patient unable to participate in goal setting Time For Goal Achievement: 04/16/15 Potential to Achieve Goals: Fair    Frequency Min 3X/week   Barriers to discharge        Co-evaluation               End of Session Equipment Utilized During Treatment: Gait belt Activity Tolerance: Patient tolerated treatment well Patient left: in chair;with call bell/phone within reach;with chair alarm set;with family/visitor present Nurse Communication: Mobility status         Time: 1549-1610 PT Time Calculation (min) (ACUTE ONLY): 21 min   Charges:   PT Evaluation $PT Eval Moderate Complexity: 1 Procedure     PT G Codes:        Shelley Fowler 04/02/2015, 4:09 PM

## 2015-04-02 NOTE — Progress Notes (Signed)
Pt not able to answer these questions at this time

## 2015-04-03 DIAGNOSIS — Z515 Encounter for palliative care: Secondary | ICD-10-CM

## 2015-04-03 DIAGNOSIS — F0280 Dementia in other diseases classified elsewhere without behavioral disturbance: Secondary | ICD-10-CM

## 2015-04-03 DIAGNOSIS — Z66 Do not resuscitate: Secondary | ICD-10-CM

## 2015-04-03 DIAGNOSIS — G308 Other Alzheimer's disease: Secondary | ICD-10-CM

## 2015-04-03 LAB — BASIC METABOLIC PANEL
Anion gap: 8 (ref 5–15)
Anion gap: 7 (ref 5–15)
BUN: 61 mg/dL — AB (ref 6–20)
BUN: 50 mg/dL — AB (ref 6–20)
CALCIUM: 9.1 mg/dL (ref 8.9–10.3)
CALCIUM: 8.6 mg/dL — AB (ref 8.9–10.3)
CO2: 25 mmol/L (ref 22–32)
CO2: 23 mmol/L (ref 22–32)
CREATININE: 1.57 mg/dL — AB (ref 0.44–1.00)
CREATININE: 1.24 mg/dL — AB (ref 0.44–1.00)
Chloride: 128 mmol/L — ABNORMAL HIGH (ref 101–111)
Chloride: 124 mmol/L — ABNORMAL HIGH (ref 101–111)
GFR calc Af Amer: 33 mL/min — ABNORMAL LOW (ref >60)
GFR calc Af Amer: 44 mL/min — ABNORMAL LOW (ref >60)
GFR calc non Af Amer: 29 mL/min — ABNORMAL LOW (ref >60)
GFR, EST NON AFRICAN AMERICAN: 38 mL/min — AB (ref >60)
GLUCOSE: 101 mg/dL — AB (ref 65–99)
GLUCOSE: 101 mg/dL — AB (ref 65–99)
Potassium: 3.9 mmol/L (ref 3.5–5.1)
Potassium: 3.7 mmol/L (ref 3.5–5.1)
Sodium: 161 mmol/L (ref 135–145)
Sodium: 154 mmol/L — ABNORMAL HIGH (ref 135–145)

## 2015-04-03 LAB — URINE CULTURE: Culture: >=100000

## 2015-04-03 LAB — GLUCOSE, CAPILLARY
GLUCOSE-CAPILLARY: 94 mg/dL (ref 65–99)
GLUCOSE-CAPILLARY: 111 mg/dL — AB (ref 65–99)
Glucose-Capillary: 32 mg/dL — CL (ref 65–99)
Glucose-Capillary: 89 mg/dL (ref 65–99)
Glucose-Capillary: 91 mg/dL (ref 65–99)

## 2015-04-03 MED ORDER — DEXTROSE 10 % IV SOLN
INTRAVENOUS | Status: DC
  Filled 2015-04-03: qty 1000

## 2015-04-03 MED ORDER — MORPHINE SULFATE (CONCENTRATE) 10 MG/0.5ML PO SOLN: 5.0000 mg | ORAL | Status: DC | PRN

## 2015-04-03 MED ORDER — DEXTROSE 50 % IV SOLN: 25.0000 mL | Freq: Once | INTRAVENOUS | Status: DC

## 2015-04-03 MED ORDER — LORAZEPAM 1 MG PO TABS: 1.0000 mg | ORAL_TABLET | Freq: Four times a day (QID) | ORAL | Status: DC | PRN

## 2015-04-03 MED ORDER — ACETAMINOPHEN 325 MG PO TABS: 650.0000 mg | ORAL_TABLET | Freq: Four times a day (QID) | ORAL | Status: DC | PRN

## 2015-04-03 NOTE — Progress Notes (Signed)
Patient is set to discharge back [facility] today. Patient & daughter, [name] made aware. Discharge packet given to RN, Florentina AddisonKatie. [facility] called for transport.     Lincoln MaxinKelly Fable Huisman, LCSW [facility] Clinical Social Worker cell #: [phone]

## 2015-04-03 NOTE — Consult Note (Signed)
Consultation Note Date: 04/03/2015   Patient Name: Shelley Fowler  DOB: 1925/01/22  MRN: [id number]  Age / Sex: 82 y.o., female  PCP: [name], MD Referring Physician: Calvert Cantor, MD  Reason for Consultation: Establishing goals of care, Hospice Evaluation, Non pain symptom management, Pain control and Psychosocial/spiritual support    Clinical Assessment/Narrative:  82 y.o. female with history of advanced dementia was brought to the ER after patient was found to be increasingly lethargic and not herself. Patient also was found to not be eating well over the last 3-4 days. In the ER CT head did not show any acute but labs reveal severe hypernatremia.  Creatinine also is increased from baseline.  Patient  admitted for  hydration. Patient does have leukocytosis but at this time is afebrile and there is no definite source for infection.   Per family continued slow physical, functional and cognitive decline over the past several months.  Focus of care for this patient is comfort per family   This NP Lorinda Creed reviewed medical records, received report from team, assessed the patient and then meet at the patient's bedside along with her daughter Steward Drone and husband Greggory Stallion to discuss diagnosis, prognosis, GOC, EOL wishes disposition and options.   A  discussion was had today regarding advanced directives.  Concepts specific to code status, artifical feeding and hydration, continued IV antibiotics and rehospitalization was had.  The difference between a aggressive medical intervention path  and a palliative comfort care path for this patient at this time was had.  Values and goals of care important to patient and family were attempted to be elicited.  Concept of Hospice was discussed  Natural trajectory and expectations at EOL were discussed.  Questions and concerns addressed.  Family encouraged to call with questions or  concerns.  PMT will continue to support holistically.  MOST form completed   Primary Decision Maker: Daughter    SUMMARY OF RECOMMENDATIONS  - discharge back to [facility] to  her AL where she has been living for 2.5 years, will hospice services -comfort and dignity are focus of care, no life prolonging measures -MOST form completed   Code Status/Advance Care Planning: DNR    Code Status Orders        Start     Ordered   04/01/15 2341  Do not attempt resuscitation (DNR)   Continuous    Question Answer Comment  In the event of cardiac or respiratory ARREST Do not call a "code blue"   In the event of cardiac or respiratory ARREST Do not perform Intubation, CPR, defibrillation or ACLS   In the event of cardiac or respiratory ARREST Use medication by any route, position, wound care, and other measures to relive pain and suffering. May use oxygen, suction and manual treatment of airway obstruction as needed for comfort.      04/01/15 2340    Code Status History    Date Active Date Inactive Code Status Order ID Comments User Context   07/22/2012  3:04 PM 07/24/2012  6:00 PM DNR [id number]  Laveda Norman, MD Inpatient      Other Directives:MOST Form  Symptom Management:   Recommend Roxanol and Ativan/ Hospice will initiate as indicated  Palliative Prophylaxis:   Bowel Regimen, Frequent Pain Assessment, Oral Care and Turn Reposition  Additional Recommendations (Limitations, Scope, Preferences):  Full Comfort Care   Psycho-social/Spiritual:  Support System: Fair Desire for further Chaplaincy support:no Additional Recommendations: Education on Hospice  Prognosis: < 4  weeks  Discharge Planning:    AL with hospice services  Chief Complaint/ Primary Diagnoses: Present on Admission:  . Hypernatremia . Acute encephalopathy . Alzheimer's disease  I have reviewed the medical record, interviewed the patient and family, and examined the patient. The following aspects  are pertinent.  Past Medical History  Diagnosis Date  . Dementia    Social History   Social History  . Marital Status: Legally Separated    Spouse Name: N/A  . Number of Children: N/A  . Years of Education: N/A   Social History Main Topics  . Smoking status: Never Smoker   . Smokeless tobacco: Never Used  . Alcohol Use: No  . Drug Use: No  . Sexual Activity: Not Asked   Other Topics Concern  . None   Social History Narrative   Family History  Problem Relation Age of Onset  . Hypertension Other    Scheduled Meds: . dextrose  25 mL Intravenous Once  . feeding supplement  1 Container Oral BID BM   Continuous Infusions:  PRN Meds:.acetaminophen **OR** acetaminophen, ondansetron **OR** ondansetron (ZOFRAN) IV Medications Prior to Admission:  Prior to Admission medications   Medication Sig Start Date End Date Taking? Authorizing Provider  Chloroxylenol-Zinc Oxide (BAZA EX) Apply 1 application topically 2 (two) times daily. Apply to buttocks twice daily after each incontinence episode   Yes Historical Provider, MD  Cholecalciferol (VITAMIN D-3) 1000 UNITS CAPS Take 1,000 Units by mouth daily.    Yes Historical Provider, MD  Emollient (CERAVE) CREA Apply 1 application topically 3 (three) times daily. Apply to bilateral lower extremities   Yes Historical Provider, MD  memantine (NAMENDA) 5 MG tablet Take 5 mg by mouth 2 (two) times daily.   Yes Historical Provider, MD  NUTRITIONAL SUPPLEMENT LIQD Take 1 Bottle by mouth 3 (three) times daily. *Mighty Shake*   Yes Historical Provider, MD  nystatin cream (MYCOSTATIN) Apply 1 application topically 3 (three) times daily as needed (rash). Apply to vaginal area for rash   Yes Historical Provider, MD  triamcinolone (KENALOG) 0.025 % cream Apply 1 application topically 2 (two) times daily as needed (itching feet). Apply to left and right feet   Yes Historical Provider, MD  acetaminophen (TYLENOL) 325 MG tablet Take 2 tablets (650 mg  total) by mouth every 6 (six) hours as needed for mild pain (or Fever >/= 101). 04/03/15   Calvert Cantor, MD  dextromethorphan-guaiFENesin (MUCINEX DM) 30-600 MG 12hr tablet Take 1 tablet by mouth 2 (two) times daily. Patient not taking: Reported on 04/01/2015 02/23/15   Courteney Lyn Mackuen, MD   No Known Allergies  Review of Systems  Unable to perform ROS: Dementia    Physical Exam  Constitutional: She appears lethargic. She appears cachectic. She appears ill.  Cardiovascular: Normal rate, regular rhythm and normal heart sounds.   Respiratory: She has decreased breath sounds in the right lower field and the left lower field.  Musculoskeletal:       Right shoulder: She exhibits decreased range of motion and decreased strength.  Neurological: She appears lethargic. She displays atrophy.    Vital Signs: BP 93/38 mmHg  Pulse 58  Temp(Src) 98.2 F (36.8 C) (Axillary)  Resp 18  Ht  (1.626 m)  Wt 38.5 kg (84 lb 14 oz)  BMI 14.56 kg/m2  SpO2 99%  SpO2: SpO2: 99 % O2 Device:SpO2: 99 % O2 Flow Rate: .   IO: Intake/output summary:  Intake/Output Summary (Last 24 hours) at 04/03/15 1306  Last data filed at 04/03/15 0700  Gross per 24 hour  Intake 1502.17 ml  Output      0 ml  Net 1502.17 ml    LBM: Last BM Date: 04/01/15 Baseline Weight: Weight: 38.5 kg (84 lb 14 oz) Most recent weight: Weight: 38.5 kg (84 lb 14 oz)      Palliative Assessment/Data:  Flowsheet Rows        Most Recent Value   Intake Tab    Referral Department  Hospitalist   Unit at Time of Referral  Cardiac/Telemetry Unit   Palliative Care Primary Diagnosis  Neurology   Date Notified  04/02/15   Palliative Care Type  New Palliative care   Reason for referral  Clarify Goals of Care   Date of Admission  04/01/15   # of days IP prior to Palliative referral  1   Clinical Assessment    Psychosocial & Spiritual Assessment    Palliative Care Outcomes       Additional Data Reviewed:  CBC:    Component  Value Date/Time   WBC 11.4* 04/02/2015 0201   WBC 10.2 07/18/2012 1317   HGB 11.6* 04/02/2015 0201   HGB 12.4 07/18/2012 1317   HCT 36.1 04/02/2015 0201   HCT 39.2 07/18/2012 1317   PLT 222 04/02/2015 0201   MCV 81.3 04/02/2015 0201   MCV 85.2 07/18/2012 1317   NEUTROABS 15.1* 04/01/2015 1859   LYMPHSABS 2.9 04/01/2015 1859   MONOABS 1.5* 04/01/2015 1859   EOSABS 0.1 04/01/2015 1859   BASOSABS 0.0 04/01/2015 1859   Comprehensive Metabolic Panel:    Component Value Date/Time   NA 154* 04/03/2015 0524   K 3.7 04/03/2015 0524   CL 124* 04/03/2015 0524   CO2 23 04/03/2015 0524   BUN 50* 04/03/2015 0524   CREATININE 1.24* 04/03/2015 0524   GLUCOSE 101* 04/03/2015 0524   CALCIUM 8.6* 04/03/2015 0524   AST 26 02/23/2015 1758   ALT 17 02/23/2015 1758   ALKPHOS 58 02/23/2015 1758   BILITOT 0.8 02/23/2015 1758   PROT 7.5 02/23/2015 1758   ALBUMIN 3.7 02/23/2015 1758     Time In: 1200 Time Out: 1315 Time Total: 75 min Greater than 50%  of this time was spent counseling and coordinating care related to the above assessment and plan.  Signed by: Lorinda CreedLARACH, Delance Weide, NP  Canary BrimMary W Kamani Lewter, NP  04/03/2015, 1:06 PM  Please contact Palliative Medicine Team phone at [phone] for questions and concerns.

## 2015-04-03 NOTE — Clinical Social Work Note (Signed)
Clinical Social Work Assessment  Patient Details  Name: Shelley Fowler MRN: [id number] Date of Birth: 1925-06-11  Date of referral:  04/03/15               Reason for consult:  Facility Placement                Permission sought to share information with:  Facility Industrial/product designerContact Representative Permission granted to share information::  Yes, Verbal Permission Granted  Name::        Agency::     Relationship::     Contact Information:     Housing/Transportation Living arrangements for the past 2 months:  Assisted Living Facility Source of Information:  Adult Children Patient Interpreter Needed:  None Criminal Activity/Legal Involvement Pertinent to Current Situation/Hospitalization:  No - Comment as needed Significant Relationships:  Adult Children Lives with:  Facility Resident Do you feel safe going back to the place where you live?  No Need for family participation in patient care:  Yes (Comment)  Care giving concerns:  CSW received consult that patient was admitted [facility].    Social Worker assessment / plan:  CSW reviewed PT evaluation recommending SNF at discharge. CSW spoke with patient's daughter, [name] at bedside re: discharge plans.  Employment status:  Retired Health and safety inspectornsurance information:  [employer] PT Recommendations:  Skilled Nursing Facility Information / Referral to community resources:  Skilled Nursing Facility  Patient/Family's Response to care:  Patient's daughter is agreeable with plan for SNF, though would [facility] so as not to add to her confusion, but understands that if she still requires 2+ assistance at discharge that she will need to go to SNF. CS[facility] & will follow-up with bed offers.   Patient/Family's Understanding of and Emotional Response to Diagnosis, Current Treatment, and Prognosis:    Emotional Assessment Appearance:  Appears stated  age Attitude/Demeanor/Rapport:    Affect (typically observed):    Orientation:  Oriented to Self Alcohol / Substance use:    Psych involvement (Current and /or in the community):     Discharge Needs  Concerns to be addressed:    Readmission within the last 30 days:    Current discharge risk:    Barriers to Discharge:      Shelley Fowler, Shelley Coulson F, LCSW 04/03/2015, 9:19 AM

## 2015-04-03 NOTE — Care Management Note (Signed)
Case Management Note  Patient Details  Name: Shelley Fowler MRN: [id number] Date of Birth: 19-Dec-1925  Subjective/Objective:  Received referral for home hospice choice, but CSW states she will manage home hospice services-patient returning back to ALF-[facility]-PMT notified.                 Action/Plan:d/c back to ALF.   Expected Discharge Date:                 Expected Discharge Plan:  Assisted Living / Rest Home  In-House Referral:  Clinical Social Work  Discharge planning Services  CM Consult  Post Acute Care Choice:    Choice offered to:     DME Arranged:    DME Agency:     HH Arranged:    HH Agency:     Status of Service:  Completed, signed off  Medicare Important Message Given:    Date Medicare IM Given:    Medicare IM give by:    Date Additional Medicare IM Given:    Additional Medicare Important Message give by:     If discussed at Long Length of Stay Meetings, dates discussed:    Additional Comments:  Shelley Fowler, Shelley Rennie, RN 04/03/2015, 12:57 PM

## 2015-04-03 NOTE — Discharge Summary (Signed)
Physician Discharge Summary  DOREAN HIEBERT WUJ:[id number] DOB: June 09, 1925 DOA: 04/01/2015  PCP: [name], MD  Admit date: 04/01/2015 Discharge date: 04/03/2015  Time spent: 50 minutes  Recommendations for Outpatient Follow-up:  1. Return to assisted living with Hospice  Discharge Condition: stable    Discharge Diagnoses:  Principal Problem:   Acute encephalopathy Active Problems:   Alzheimer's disease   Hypernatremia   Pressure ulcer   Protein-calorie malnutrition, severe   History of present illness:  Shelley Fowler is a 82 y.o. female female who resides in assisted living with history of advanced dementia was brought to the ER after patient was found to be increasingly lethargic. CBG was 52. Bmet shows severe dehydration, hypernatermia, hyperchrolremia.   Hospital Course:  Principal Problem:  Acute encephalopathy  - due to electrolyte derangements, severe dehydration and possibly UTI  Active Problems: Severe hypernatremia/ hyperchloremia due to dehydration/ Protein-calorie malnutrition, severe -  poor PO intake for > many month with gradual weight loss- cachectic on exam- I have asked for a palliative care consult - spoke with her daughter in presence of Corrie Dandy from palliative care- daughter also agrees with palliative care due to her mothers poor oral intake and severe weight loss -  will allow patient to return to Assisted living with hospice- she has not been uncomfortable and is therefore not requiring PRN pain meds.  UTI - amoxil- stop treating   Alzheimer's disease with severe dementia - doubt Namenda is helpful at this stage- will hold   Pressure ulcer - wound care   Consultations:  *palliative care  Discharge Exam: Filed Weights   04/01/15 2325  Weight: 38.5 kg (84 lb 14 oz)   Filed Vitals:   04/02/15 2331 04/03/15 0608  BP: 98/47 93/38  Pulse: 66 58  Temp: 97.4 F (36.3 C) 98.2 F (36.8 C)  Resp: 18 18    General: AAO x 3, no  distress Cardiovascular: RRR, no murmurs  Respiratory: clear to auscultation bilaterally GI: soft, non-tender, non-distended, bowel sound positive  Discharge Instructions You were cared for by a hospitalist during your hospital stay. If you have any questions about your discharge medications or the care you received while you were in the hospital after you are discharged, you can call the unit and asked to speak with the hospitalist on call if the hospitalist that took care of you is not available. Once you are discharged, your primary care physician will handle any further medical issues. Please note that NO REFILLS for any discharge medications will be authorized once you are discharged, as it is imperative that you return to your primary care physician (or establish a relationship with a primary care physician if you do not have one) for your aftercare needs so that they can reassess your need for medications and monitor your lab values.      Discharge Instructions    Discharge instructions    Complete by:  As directed   Diet as tolerated- hospice to follow            Medication List    STOP taking these medications        dextromethorphan-guaiFENesin 30-600 MG 12hr tablet  Commonly known as:  MUCINEX DM     memantine 5 MG tablet  Commonly known as:  NAMENDA     NUTRITIONAL SUPPLEMENT Liqd     nystatin cream  Commonly known as:  MYCOSTATIN     triamcinolone 0.025 % cream  Commonly known as:  KENALOG  Vitamin D-3 1000 units Caps      TAKE these medications        acetaminophen 325 MG tablet  Commonly known as:  TYLENOL  Take 2 tablets (650 mg total) by mouth every 6 (six) hours as needed for mild pain (or Fever >/= 101).     BAZA EX  Apply 1 application topically 2 (two) times daily. Apply to buttocks twice daily after each incontinence episode     CERAVE Crea  Apply 1 application topically 3 (three) times daily. Apply to bilateral lower extremities       No  Known Allergies    The results of significant diagnostics from this hospitalization (including imaging, microbiology, ancillary and laboratory) are listed below for reference.    Significant Diagnostic Studies: Dg Chest 2 View  04/01/2015  CLINICAL DATA:  URI symptoms, congestion, decreased appetite. EXAM: CHEST  2 VIEW COMPARISON:  02/23/2015 FINDINGS: Lungs are clear.  No pleural effusion or pneumothorax. The heart is normal size. Degenerative changes of the visualized thoracolumbar spine. IMPRESSION: No evidence of acute cardiopulmonary disease. Electronically Signed   By: Charline BillsSriyesh  Krishnan M.D.   On: 04/01/2015 18:28   Ct Head Wo Contrast  04/01/2015  CLINICAL DATA:  Altered mental status EXAM: CT HEAD WITHOUT CONTRAST TECHNIQUE: Contiguous axial images were obtained from the base of the skull through the vertex without intravenous contrast. COMPARISON:  None. FINDINGS: Bony calvarium is intact. Diffuse atrophic changes and chronic white matter ischemic changes are seen. No findings to suggest acute hemorrhage, acute infarction or space-occupying mass lesion are noted. IMPRESSION: Chronic atrophic and ischemic changes without acute abnormality. Electronically Signed   By: Alcide CleverMark  Lukens M.D.   On: 04/01/2015 19:22    Microbiology: Recent Results (from the past 240 hour(s))  MRSA PCR Screening     Status: None   Collection Time: 04/01/15 11:46 PM  Result Value Ref Range Status   MRSA by PCR NEGATIVE NEGATIVE Final    Comment:        The GeneXpert MRSA Assay (FDA approved for NASAL specimens only), is one component of a comprehensive MRSA colonization surveillance program. It is not intended to diagnose MRSA infection nor to guide or monitor treatment for MRSA infections.      Labs: Basic Metabolic Panel:  Recent Labs Lab 04/01/15 1859 04/02/15 0201 04/02/15 0806 04/02/15 1410 04/03/15 0524  NA 170* 168* 164* 161* 154*  K 4.3 3.9 4.1 3.9 3.7  CL >130* >130* >130* 128*  124*  CO2 28 24 24 25 23   GLUCOSE 121* 128* 127* 101* 101*  BUN 78* 78* 66* 61* 50*  CREATININE 2.00* 1.86* 1.73* 1.57* 1.24*  CALCIUM 9.9 9.2 9.2 9.1 8.6*   Liver Function Tests: No results for input(s): AST, ALT, ALKPHOS, BILITOT, PROT, ALBUMIN in the last 168 hours. No results for input(s): LIPASE, AMYLASE in the last 168 hours. No results for input(s): AMMONIA in the last 168 hours. CBC:  Recent Labs Lab 04/01/15 1859 04/02/15 0201  WBC 19.5* 11.4*  NEUTROABS 15.1*  --   HGB 12.7 11.6*  HCT 38.7 36.1  MCV 79.8 81.3  PLT 232 222   Cardiac Enzymes: No results for input(s): CKTOTAL, CKMB, CKMBINDEX, TROPONINI in the last 168 hours. BNP: BNP (last 3 results) No results for input(s): BNP in the last 8760 hours.  ProBNP (last 3 results) No results for input(s): PROBNP in the last 8760 hours.  CBG:  Recent Labs Lab 04/02/15 1746 04/03/15 0003 04/03/15 0111 04/03/15  1610 04/03/15 1148  GLUCAP 133* 32* 111* 91 89       Signed:  Calvert Cantor, MD [facility] 04/03/2015, 1:00 PM

## 2015-04-03 NOTE — Clinical Social Work Placement (Signed)
   CLINICAL SOCIAL WORK PLACEMENT  NOTE  Date:  04/03/2015  Patient Details  Name: Shelley Fowler MRN: [id number] Date of Birth: [birth year]  Clinical Social Work is seeking post-discharge placement for this patient at the Skilled  Nursing Facility level of care (*CSW will initial, date and re-position this form in  chart as items are completed):  Yes   Patient/family provided with [facility] Clinical Social Work Department's list of facilities offering this level of care within the geographic area requested by the patient (or if unable, by the patient's family).  Yes   Patient/family informed of their freedom to choose among providers that offer the needed level of care, that participate in Medicare, Medicaid or managed care program needed by the patient, have an available bed and are willing to accept the patient.  Yes   Patient/family informed of [facility]'s ownership interest in [facility] and [facility], as well as of the fact that they are under no obligation to receive care at these facilities.  PASRR submitted to EDS on       PASRR number received on       Existing PASRR number confirmed on 04/03/15     FL2 transmitted to all facilities in geographic area requested by pt/family on 04/03/15     FL2 transmitted to all facilities within larger geographic area on       Patient informed that his/her managed care company has contracts with or will negotiate with certain facilities, including the following:            Patient/family informed of bed offers received.  Patient chooses bed at       Physician recommends and patient chooses bed at      Patient to be transferred to   on  .  Patient to be transferred to facility by       Patient family notified on   of transfer.  Name of family member notified:        PHYSICIAN       Additional Comment:    _______________________________________________ Arlyss RepressHarrison, Stephinie Battisti F, LCSW 04/03/2015, 9:22 AM

## 2015-04-03 NOTE — Progress Notes (Addendum)
Attempted to call report to RN at [facility].  Left message on answering machine.   Will try back later.  On second attempt report called and given to [facility] at [facility].  Spoke with Health visitornursing manager at [facility] and confirmed that patient was appropriate for the memory care unit. Waiting for transport.

## 2015-04-03 NOTE — NC FL2 (Signed)
  [place] MEDICAID FL2 LEVEL OF CARE SCREENING TOOL     IDENTIFICATION  Patient Name: Shelley Leedsnn J Mich Birthdate: 1925-07-18 Sex: female Admission Date (Current Location): 04/01/2015  [facility] [place]   Facility and Address:  [facility],  [address], [place] [id number]      Provider Number: [id number]  Attending Physician Name and Address:  Calvert CantorSaima Rizwan, MD  Relative Name and Phone Number:       Current Level of Care: Hospital Recommended Level of Care: ALF/Memory Care Unit Prior Approval Number:    Date Approved/Denied:   PASRR Number: [id number]  Discharge Plan: Return to ALF/Memory Care Unit   Current Diagnoses: Patient Active Problem List   Diagnosis Date Noted  . Pressure ulcer 04/02/2015  . Protein-calorie malnutrition, severe 04/02/2015  . Hypernatremia 04/01/2015  . Acute encephalopathy 04/01/2015  . Cellulitis of leg, left 07/22/2012  . Alzheimer's disease 07/22/2012    Orientation RESPIRATION BLADDER Height & Weight     Self  Normal Continent Weight: 84 lb 14 oz (38.5 kg) Height:  5\' 4"  (162.6 cm) (estimate)  BEHAVIORAL SYMPTOMS/MOOD NEUROLOGICAL BOWEL NUTRITION STATUS      Continent Diet (Regular)  AMBULATORY STATUS COMMUNICATION OF NEEDS Skin   Extensive Assist Verbally PU Stage and Appropriate Care (PressureUlcer03/26/17StageI-Intactskinwithnon-blanchablerednessofalocalizedareausuallyoverabonyprominence.redareathatdoesnotblancheonLhip & PressureUlcer03/26/17StageII-Partialthicknesslossofdermispresentingasa)                       Personal Care Assistance Level of Assistance  Bathing, Dressing Bathing Assistance: Limited assistance   Dressing Assistance: Limited assistance     Functional Limitations Info             SPECIAL CARE FACTORS FREQUENCY       Contractures      Additional Factors Info  Code Status, Allergies Code Status Info: DNR Allergies  Info: NKDA            Discharge Medications: Medication List    STOP taking these medications       dextromethorphan-guaiFENesin 30-600 MG 12hr tablet  Commonly known as: MUCINEX DM     memantine 5 MG tablet  Commonly known as: NAMENDA     NUTRITIONAL SUPPLEMENT Liqd     nystatin cream  Commonly known as: MYCOSTATIN     triamcinolone 0.025 % cream  Commonly known as: KENALOG     Vitamin D-3 1000 units Caps      TAKE these medications       acetaminophen 325 MG tablet  Commonly known as: TYLENOL  Take 2 tablets (650 mg total) by mouth every 6 (six) hours as needed for mild pain (or Fever >/= 101).     BAZA EX  Apply 1 application topically 2 (two) times daily. Apply to buttocks twice daily after each incontinence episode     CERAVE Crea  Apply 1 application topically 3 (three) times daily. Apply to bilateral lower extremities           Relevant Imaging Results:  Relevant Lab Results:   Additional Information SSN: [id number]  Arlyss RepressHarrison, Tasheena [place] F, LCSW

## 2015-08-04 ENCOUNTER — Emergency Department (HOSPITAL_BASED_OUTPATIENT_CLINIC_OR_DEPARTMENT_OTHER)
Admission: EM | Admit: 2015-08-04 | Discharge: 2015-08-05 | Disposition: A | Discharge status: Home / Self Care | Attending: Emergency Medicine | Admitting: Emergency Medicine

## 2015-08-04 ENCOUNTER — Encounter (HOSPITAL_BASED_OUTPATIENT_CLINIC_OR_DEPARTMENT_OTHER): Payer: Self-pay | Admitting: *Deleted

## 2015-08-04 DIAGNOSIS — W19XXXA Unspecified fall, initial encounter: Secondary | ICD-10-CM

## 2015-08-04 DIAGNOSIS — Y929 Unspecified place or not applicable: Secondary | ICD-10-CM | POA: Diagnosis not present

## 2015-08-04 DIAGNOSIS — Y999 Unspecified external cause status: Secondary | ICD-10-CM | POA: Diagnosis not present

## 2015-08-04 DIAGNOSIS — Z043 Encounter for examination and observation following other accident: Secondary | ICD-10-CM | POA: Insufficient documentation

## 2015-08-04 DIAGNOSIS — Y939 Activity, unspecified: Secondary | ICD-10-CM | POA: Insufficient documentation

## 2015-08-04 DIAGNOSIS — W06XXXA Fall from bed, initial encounter: Secondary | ICD-10-CM | POA: Diagnosis not present

## 2015-08-04 NOTE — ED Notes (Signed)
Report given to [facility].  

## 2015-08-04 NOTE — ED Notes (Signed)
Unable to assess sacrum area due to increase in patient aggitation. EDP at bedside.

## 2015-08-04 NOTE — ED Notes (Signed)
[facility] called to transport pt back to the nursing home.

## 2015-08-04 NOTE — ED Provider Notes (Signed)
[facility] Provider Note   CSN: [id number] Arrival date & time: 08/04/15  2132  By signing my name below, I, [name], attest that this documentation has been prepared under the direction and in the presence of Melene Plan, DO. Electronically Signed: [name], ED Scribe. 08/04/15. 9:49 PM.   First MD Initiated Contact with Patient 08/04/15 2149     History   Chief Complaint Chief Complaint  Patient presents with  . Fall   LEVEL 5 CAVEAT DUE TO DEMENTIA  The history is provided by the patient, a caregiver and the nursing home.    HPI Comments: Shelley Fowler is a 82 y.o. female who presents to the Emergency Department complaining of a fall earlier this night. Per nursing home is reported to have fallen out of a low height bed today. Pt states she remembers the fall, but does not know what happened. Pt states she does not hurt anywhere. Pt states she is slightly agitated with being in ED.   Past Medical History:  Diagnosis Date  . Dementia     Patient Active Problem List   Diagnosis Date Noted  . Palliative care encounter 04/03/2015  . DNR (do not resuscitate) 04/03/2015  . Pressure ulcer 04/02/2015  . Protein-calorie malnutrition, severe 04/02/2015  . Hypernatremia 04/01/2015  . Acute encephalopathy 04/01/2015  . Cellulitis of leg, left 07/22/2012  . Alzheimer's disease 07/22/2012    Past Surgical History:  Procedure Laterality Date  . ABDOMINAL HYSTERECTOMY      OB History    No data available       Home Medications    Prior to Admission medications   Medication Sig Start Date End Date Taking? Authorizing Provider  acetaminophen (TYLENOL) 325 MG tablet Take 2 tablets (650 mg total) by mouth every 6 (six) hours as needed for mild pain (or Fever >/= 101). 04/03/15   Calvert Cantor, MD  Chloroxylenol-Zinc Oxide (BAZA EX) Apply 1 application topically 2 (two) times daily. Apply to buttocks twice daily after each incontinence episode    Historical  Provider, MD  Emollient (CERAVE) CREA Apply 1 application topically 3 (three) times daily. Apply to bilateral lower extremities    Historical Provider, MD    Family History Family History  Problem Relation Age of Onset  . Hypertension Other     Social History Social History  Substance Use Topics  . Smoking status: Never Smoker  . Smokeless tobacco: Never Used  . Alcohol use No     Allergies   Review of patient's allergies indicates no known allergies.   Review of Systems Review of Systems  Unable to perform ROS: Dementia     Physical Exam Updated Vital Signs BP 117/94 (BP Location: Right Arm)   Pulse 69   Temp 98.1 F (36.7 C) (Oral)   Resp 20   SpO2 96%   Physical Exam  Constitutional: No distress.  HENT:  Head: Normocephalic and atraumatic.  Eyes: EOM are normal. Pupils are equal, round, and reactive to light.  Neck: Normal range of motion. Neck supple.  Cardiovascular: Normal rate and regular rhythm.  Exam reveals no gallop and no friction rub.   No murmur heard. Pulmonary/Chest: Effort normal. She has no wheezes. She has no rales.  Abdominal: Soft. She exhibits no distension. There is no tenderness.  Musculoskeletal: She exhibits no edema or tenderness.  NO TENDERNESS FROM HEAD TO TOE ON PALPATION  Neurological:  DEMENTED  INNAPROPRIATE RESPONSES TO QUESTIONS  Skin: Skin is warm and  dry. She is not diaphoretic.  Psychiatric: She has a normal mood and affect. Her behavior is normal.  Nursing note and vitals reviewed.    ED Treatments / Results  DIAGNOSTIC STUDIES: Oxygen Saturation is 96% on RA, adequateby my interpretation.    Labs (all labs ordered are listed, but only abnormal results are displayed) Labs Reviewed - No data to display  EKG  EKG Interpretation None       Radiology No results found.  Procedures Procedures (including critical care time)  Medications Ordered in ED Medications - No data to display   Initial  Impression / Assessment and Plan / ED Course  I have reviewed the triage vital signs and the nursing notes.  Pertinent labs & imaging results that were available during my care of the patient were reviewed by me and considered in my medical decision making (see chart for details).  Clinical Course    82 yo F With a chief complaint of a fall. Patient fell out of her bed which is a low bed only about that off the ground. Patient has no signs of trauma denies any complaints. She is palpated from head to toe without a significant findings. I do not feel that imaging is warranted at this time. Discharge home.  10:32 PM:  I have discussed the diagnosis/risks/treatment options with the patient and family and believe the pt to be eligible for discharge home to follow-up with PCP. We also discussed returning to the ED immediately if new or worsening sx occur. We discussed the sx which are most concerning (e.g., sudden worsening pain, fever, inability to tolerate by mouth) that necessitate immediate return. Medications administered to the patient during their visit and any new prescriptions provided to the patient are listed below.  Medications given during this visit Medications - No data to display   The patient appears reasonably screen and/or stabilized for discharge and I doubt any other medical condition or other [facility] requiring further screening, evaluation, or treatment in the ED at this time prior to discharge.    Final Clinical Impressions(s) / ED Diagnoses   Final diagnoses:  Fall, initial encounter    New Prescriptions New Prescriptions   No medications on file    I personally performed the services described in this documentation, which was scribed in my presence. The recorded information has been reviewed and is accurate.     Melene Plan, DO 08/04/15 2232

## 2015-08-05 NOTE — ED Notes (Signed)
Pt sent back to SNF via [facility]. Report called to facility per [facility], RN.

## 2015-10-19 ENCOUNTER — Emergency Department (HOSPITAL_COMMUNITY): Payer: Medicare Other

## 2015-10-19 ENCOUNTER — Encounter (HOSPITAL_COMMUNITY): Payer: Self-pay

## 2015-10-19 ENCOUNTER — Emergency Department (HOSPITAL_COMMUNITY)
Admission: EM | Admit: 2015-10-19 | Discharge: 2015-10-19 | Disposition: A | Discharge status: Home / Self Care | Payer: Medicare Other | Attending: Emergency Medicine | Admitting: Emergency Medicine

## 2015-10-19 DIAGNOSIS — Z79899 Other long term (current) drug therapy: Secondary | ICD-10-CM | POA: Diagnosis not present

## 2015-10-19 DIAGNOSIS — R4182 Altered mental status, unspecified: Secondary | ICD-10-CM | POA: Diagnosis present

## 2015-10-19 DIAGNOSIS — G309 Alzheimer's disease, unspecified: Secondary | ICD-10-CM | POA: Diagnosis not present

## 2015-10-19 DIAGNOSIS — R569 Unspecified convulsions: Secondary | ICD-10-CM | POA: Diagnosis not present

## 2015-10-19 DIAGNOSIS — R404 Transient alteration of awareness: Secondary | ICD-10-CM | POA: Insufficient documentation

## 2015-10-19 LAB — COMPREHENSIVE METABOLIC PANEL
ALBUMIN: 3.4 g/dL — AB (ref 3.5–5.0)
ALT: 22 U/L (ref 14–54)
ANION GAP: 5 (ref 5–15)
AST: 34 U/L (ref 15–41)
Alkaline Phosphatase: 60 U/L (ref 38–126)
BUN: 28 mg/dL — ABNORMAL HIGH (ref 6–20)
CHLORIDE: 109 mmol/L (ref 101–111)
CO2: 28 mmol/L (ref 22–32)
Calcium: 9.3 mg/dL (ref 8.9–10.3)
Creatinine, Ser: 1.01 mg/dL — ABNORMAL HIGH (ref 0.44–1.00)
GFR calc non Af Amer: 48 mL/min — ABNORMAL LOW (ref >60)
GFR, EST AFRICAN AMERICAN: 56 mL/min — AB (ref >60)
GLUCOSE: 110 mg/dL — AB (ref 65–99)
Potassium: 4 mmol/L (ref 3.5–5.1)
SODIUM: 142 mmol/L (ref 135–145)
Total Bilirubin: 0.9 mg/dL (ref 0.3–1.2)
Total Protein: 6.5 g/dL (ref 6.5–8.1)

## 2015-10-19 LAB — CBC WITH DIFFERENTIAL/PLATELET
BASOS ABS: 0 10*3/uL (ref 0.0–0.1)
BASOS PCT: 0 %
EOS ABS: 0.1 10*3/uL (ref 0.0–0.7)
Eosinophils Relative: 1 %
HEMATOCRIT: 35.5 % — AB (ref 36.0–46.0)
HEMOGLOBIN: 11.7 g/dL — AB (ref 12.0–15.0)
Lymphocytes Relative: 19 %
Lymphs Abs: 1.6 10*3/uL (ref 0.7–4.0)
MCH: 26.2 pg (ref 26.0–34.0)
MCHC: 33 g/dL (ref 30.0–36.0)
MCV: 79.4 fL (ref 78.0–100.0)
MONOS PCT: 8 %
Monocytes Absolute: 0.7 10*3/uL (ref 0.1–1.0)
NEUTROS ABS: 6.1 10*3/uL (ref 1.7–7.7)
NEUTROS PCT: 72 %
Platelets: 180 10*3/uL (ref 150–400)
RBC: 4.47 MIL/uL (ref 3.87–5.11)
RDW: 15 % (ref 11.5–15.5)
WBC: 8.5 10*3/uL (ref 4.0–10.5)

## 2015-10-19 LAB — URINALYSIS, ROUTINE W REFLEX MICROSCOPIC
BILIRUBIN URINE: NEGATIVE
Glucose, UA: NEGATIVE mg/dL
HGB URINE DIPSTICK: NEGATIVE
Ketones, ur: NEGATIVE mg/dL
Leukocytes, UA: NEGATIVE
NITRITE: NEGATIVE
PROTEIN: NEGATIVE mg/dL
SPECIFIC GRAVITY, URINE: 1.013 (ref 1.005–1.030)
pH: 6.5 (ref 5.0–8.0)

## 2015-10-19 LAB — TROPONIN I
TROPONIN I: 0.03 ng/mL — AB (ref <0.03)
TROPONIN I: 0.03 ng/mL — AB (ref <0.03)

## 2015-10-19 NOTE — ED Notes (Signed)
Called [facility] for transport. Gave report to facility.

## 2015-10-19 NOTE — ED Triage Notes (Signed)
Per EMS, pt have [facility]. Pt was standing with staff. Staff noted her eyes to be darting back and forth.  Drooling at the mouth. They sat patient down and she was less responsive to questioning.  Pt was incontinent of bowel.  Unknown for usual incontinence.  Pt has alzhiemers and does not answer to questions.  Aggravated with EMS care.  Vitals: 136/84, hr 64, rr 16, 98% ra, cbg 121

## 2015-10-19 NOTE — ED Notes (Signed)
Pt's daughter at bedside.  Verbalized understanding of discharge instructions.  Will sign for transfer/discharge of patient.

## 2015-10-19 NOTE — ED Provider Notes (Signed)
[facility]-EMERGENCY DEPT Provider Note   CSN: [id number] Arrival date & time: 10/19/15  1610     History   Chief Complaint Chief Complaint  Patient presents with  . Altered Mental Status    HPI Shelley Fowler is a 82 y.o. female.  HPI History is per EMS. Patient is coming from [facility]. Reportedly the patient was in a standing position and her eyes deviated toward the side and had a period of nystagmus like motion. She reportedly drooled some. There is no report of fall or loss of postural tone. Patient reportedly placed in a seated position and within about 8 minutes started swearing which is baseline. EMS reports VS stable for transport. Report is that at baseline patient is mostly verbally noncommunicative but will mumble under her breath. Past Medical History:  Diagnosis Date  . Dementia     Patient Active Problem List   Diagnosis Date Noted  . Palliative care encounter 04/03/2015  . DNR (do not resuscitate) 04/03/2015  . Pressure ulcer 04/02/2015  . Protein-calorie malnutrition, severe 04/02/2015  . Hypernatremia 04/01/2015  . Acute encephalopathy 04/01/2015  . Cellulitis of leg, left 07/22/2012  . Alzheimer's disease 07/22/2012    Past Surgical History:  Procedure Laterality Date  . ABDOMINAL HYSTERECTOMY      OB History    No data available       Home Medications    Prior to Admission medications   Medication Sig Start Date End Date Taking? Authorizing Provider  Chloroxylenol-Zinc Oxide (BAZA EX) Apply 1 application topically 2 (two) times daily. Apply to buttocks twice daily after each incontinence episode   Yes Historical Provider, MD  cholecalciferol (VITAMIN D) 1000 units tablet Take 1,000 Units by mouth daily with breakfast.   Yes Historical Provider, MD  Emollient (CERAVE) CREA Apply 1 application topically 3 (three) times daily. Apply to bilateral lower extremities   Yes Historical Provider, MD  memantine (NAMENDA) 5 MG tablet Take 5 mg by mouth 2 (two) times  daily.   Yes Historical Provider, MD  Nutritional Supplements (NUTRITIONAL DRINK) LIQD Take 1 Can by mouth 3 (three) times daily. *Mighty Shakes*   Yes Historical Provider, MD  nystatin cream (MYCOSTATIN) Apply 1 application topically 3 (three) times daily as needed (for rash).   Yes Historical Provider, MD  triamcinolone (KENALOG) 0.025 % cream Apply 1 application topically 2 (two) times daily as needed (for itching feet). Apply to the left or right foot   Yes Historical Provider, MD    Family History Family History  Problem Relation Age of Onset  . Hypertension Other     Social History Social History  Substance Use Topics  . Smoking status: Never Smoker  . Smokeless tobacco: Never Used  . Alcohol use No     Allergies   Review of patient's allergies indicates no known allergies.   Review of Systems Review of Systems Cannot obtain ROS Level 5 caveat dementia.  Physical Exam Updated Vital Signs BP (!) 127/52 (BP Location: Left Arm)   Pulse 67   Temp 98.6 F (37 C) (Oral)   Resp 18   SpO2 95%   Physical Exam  Constitutional:  Patient is very thin. Lying on her right side with arms and legs drawn against her body. No respiratory distress.  HENT:  Head: Normocephalic and atraumatic.  Mouth/Throat: Oropharynx is clear and moist.  Eyes: EOM are normal.  Patient draws eyes closed as I try to examen but eyes move conjugate.  Neck: Neck supple.  Cardiovascular: Normal rate, regular rhythm, normal heart sounds and intact distal pulses.   Pulmonary/Chest: Effort normal.  Poor effort at deep inspiration. No gross W/R/R  Abdominal: Soft. She exhibits no distension.  Musculoskeletal:  No deformity. Patient keeping extremities in moderate flexion, resists passive ROM. Edema 2+ RLE with skin scarring, appears chronic.  Neurological:  Not cooperative to perform exam. No focal extremity flaccidity.  Skin: Skin is warm and dry.     ED Treatments / Results  Labs (all labs  ordered are listed, but only abnormal results are displayed) Labs Reviewed  CBC WITH DIFFERENTIAL/PLATELET - Abnormal; Notable for the following:       Result Value   Hemoglobin 11.7 (*)    HCT 35.5 (*)    All other components within normal limits  TROPONIN I - Abnormal; Notable for the following:    Troponin I 0.03 (*)    All other components within normal limits  COMPREHENSIVE METABOLIC PANEL - Abnormal; Notable for the following:    Glucose, Bld 110 (*)    BUN 28 (*)    Creatinine, Ser 1.01 (*)    Albumin 3.4 (*)    GFR calc non Af Amer 48 (*)    GFR calc Af Amer 56 (*)    All other components within normal limits  TROPONIN I - Abnormal; Notable for the following:    Troponin I 0.03 (*)    All other components within normal limits  URINALYSIS, ROUTINE W REFLEX MICROSCOPIC (NOT AT [facility])    EKG  EKG Interpretation None       Radiology Dg Chest 2 View  Result Date: 10/19/2015 CLINICAL DATA:  Initial evaluation for the the acute altered mental status. EXAM: CHEST  2 VIEW COMPARISON:  Prior radiograph from earlier the same day. FINDINGS: Evaluation is limited by patient positioning and suboptimal inspiration. Cardiac and mediastinal silhouettes are gross stable. Lungs are hypoinflated. Previously noted density overlying the peripheral right lung is no longer clearly visualized. This may reflect atelectasis on prior exam. Lateral view limited by the patient's overlying arms. No new focal airspace disease. No pulmonary edema or pleural effusion. Osseous structures unchanged. IMPRESSION: 1. Limited study due to patient positioning and suboptimal inspiration. Previously noted density within the right lung no longer clearly visualized, and may reflect atelectasis on that prior exam. 2. No other new active cardiopulmonary disease. Electronically Signed   By: Rise Mu M.D.   On: 10/19/2015 19:48   Dg Chest Port 1 View  Result Date: 10/19/2015 CLINICAL DATA:  Mental status  change. EXAM: PORTABLE CHEST 1 VIEW COMPARISON:  April 01, 2015 FINDINGS: A nodular density on the initial frontal view appears more linear on the second frontal view, likely scar or atelectasis. No other acute abnormalities or interval changes. IMPRESSION: 1. No acute abnormality. 2. Density over the lateral right chest appears to be linear on 1 of the two views and more nodular on the other. I suspect this is scar or atelectasis. Recommend a PA and lateral chest x-ray before discharge. Electronically Signed   By: Gerome Sam III M.D   On: 10/19/2015 18:29    Procedures Procedures (including critical care time)  Medications Ordered in ED Medications - No data to display   Initial Impression / Assessment and Plan / ED Course  I have reviewed the triage vital signs and the nursing notes.  Pertinent labs & imaging results that were available during my care of the patient were reviewed by me and considered  in my medical decision making (see chart for details).  Clinical Course    Final Clinical Impressions(s) / ED Diagnoses   Final diagnoses:  Transient alteration of awareness  Observed seizure-like activity (HCC)  Is back at baseline. Diagnostic evaluation does not show any acute anomaly. Signs are stable. At this point patient will be discharged back to nursing home care. This may have been seizure-like activity. Patient d/c instructions are to follow up with PCP for further outpatient evaluation.  New Prescriptions New Prescriptions   No medications on file     Arby BarretteMarcy Zaharah Amir, MD 10/19/15 2116

## 2015-10-19 NOTE — ED Notes (Signed)
Pt transported to XR.  Will draw troponin when she returns.

## 2015-11-05 ENCOUNTER — Emergency Department (HOSPITAL_COMMUNITY)
Admission: EM | Admit: 2015-11-05 | Discharge: 2015-11-05 | Disposition: A | Discharge status: Home / Self Care | Payer: Medicare Other | Attending: Emergency Medicine | Admitting: Emergency Medicine

## 2015-11-05 DIAGNOSIS — Y92129 Unspecified place in nursing home as the place of occurrence of the external cause: Secondary | ICD-10-CM | POA: Diagnosis not present

## 2015-11-05 DIAGNOSIS — Y939 Activity, unspecified: Secondary | ICD-10-CM | POA: Diagnosis not present

## 2015-11-05 DIAGNOSIS — W1839XA Other fall on same level, initial encounter: Secondary | ICD-10-CM | POA: Diagnosis not present

## 2015-11-05 DIAGNOSIS — Z79899 Other long term (current) drug therapy: Secondary | ICD-10-CM | POA: Insufficient documentation

## 2015-11-05 DIAGNOSIS — G309 Alzheimer's disease, unspecified: Secondary | ICD-10-CM | POA: Diagnosis not present

## 2015-11-05 DIAGNOSIS — Y999 Unspecified external cause status: Secondary | ICD-10-CM | POA: Diagnosis not present

## 2015-11-05 DIAGNOSIS — Z043 Encounter for examination and observation following other accident: Secondary | ICD-10-CM | POA: Diagnosis present

## 2015-11-05 DIAGNOSIS — W19XXXA Unspecified fall, initial encounter: Secondary | ICD-10-CM

## 2015-11-05 NOTE — ED Triage Notes (Addendum)
Per EMS, pt is from [facility]. Pt experienced a fall while standing. Staff witnessed the fall and said it looked as if pt " just sat down". Staff stated pt did not hit head or lose consciousness. Pt does not take any blood thinners. Pt has a hx of alzheimer's and has minimal verbal communication. Pt is a hospice pt. Daughter is POA and was contacted while being transported by EMS. EMS reports pt does not show any signs of any pain.

## 2015-11-05 NOTE — ED Notes (Signed)
[facility] notified regarding patient transport. 

## 2015-11-05 NOTE — ED Notes (Signed)
Pt gown and brief changed. Personal belongings put in bag for transport.

## 2015-11-05 NOTE — ED Provider Notes (Signed)
[facility]-EMER[facility] DEPT Provider Note   CSN: [id number] Arrival date & time: 11/05/15  [id number]  By signing my name below, I, Valentino SaxonBianca Contreras, attest that this documentation has been prepared under the direction and in the presence of Cheri FowlerKayla Adella Manolis, [place]. Electronically Signed: Valentino SaxonBianca Contreras, ED Scribe. 11/05/15. 8:34 PM.  History   Chief Complaint Chief Complaint  Patient presents with  . Fall   The history is provided by the patient and a relative. No language interpreter was used.   HPI Comments: Shelley Fowler is a 82 y.o. female brought in by EMS who presents to the Emergency Department [facility] that occurred at 7:30 pm this evening. Per Pt's relative, she states pt is denying any pain at this time. She states there is no changes in pt's baseline. Per staff at nursing home, they report that Pt was in a standing position when she went to go sit down in a chair that she thought was behind her, she ended up falling. She denies LOC and head injury. Per pt's relative, she was able to ambulate after fall without difficulty. The nursing home advised pt to be seen at ED per protocol for further evaluation. She denies any pain. Pt's daughter reports pt has minimal verbal communication. Pt's relative states she is not currently on any blood thinners. No additional complaints at this moment.   Past Medical History:  Diagnosis Date  . Dementia     Patient Active Problem List   Diagnosis Date Noted  . Palliative care encounter 04/03/2015  . DNR (do not resuscitate) 04/03/2015  . Pressure ulcer 04/02/2015  . Protein-calorie malnutrition, severe 04/02/2015  . Hypernatremia 04/01/2015  . Acute encephalopathy 04/01/2015  . Cellulitis of leg, left 07/22/2012  . Alzheimer's disease 07/22/2012    Past Surgical History:  Procedure Laterality Date  . ABDOMINAL HYSTERECTOMY      OB History    No data available       Home Medications    Prior to Admission  medications   Medication Sig Start Date End Date Taking? Authorizing Provider  acetaminophen (TYLENOL) 500 MG tablet Take 1,000 mg by mouth 3 (three) times daily.   Yes Historical Provider, MD  Chloroxylenol-Zinc Oxide (BAZA EX) Apply 1 application topically 2 (two) times daily. Apply to buttocks twice daily after each incontinence episode   Yes Historical Provider, MD  cholecalciferol (VITAMIN D) 1000 units tablet Take 1,000 Units by mouth daily with breakfast.   Yes Historical Provider, MD  Emollient (CERAVE) CREA Apply 1 application topically 3 (three) times daily. Apply to bilateral lower extremities   Yes Historical Provider, MD  LORazepam (ATIVAN) 0.5 MG tablet Take 0.25 mg by mouth every 12 (twelve) hours.   Yes Historical Provider, MD  memantine (NAMENDA) 5 MG tablet Take 5 mg by mouth daily.    Yes Historical Provider, MD  Nutritional Supplements (NUTRITIONAL DRINK) LIQD Take 1 Can by mouth 3 (three) times daily. *Mighty Shakes*   Yes Historical Provider, MD  oxyCODONE-acetaminophen (PERCOCET/ROXICET) 5-325 MG tablet Take 1 tablet by mouth every 4 (four) hours as needed for severe pain.   Yes Historical Provider, MD  senna (SENOKOT) 8.6 MG tablet Take 1 tablet by mouth daily.   Yes Historical Provider, MD  triamcinolone (KENALOG) 0.025 % cream Apply 1 application topically 2 (two) times daily as needed (for itching feet). Apply to the left or right foot   Yes Historical Provider, MD  nystatin cream (MYCOSTATIN) Apply 1 application topically  3 (three) times daily as needed (for rash).    Historical Provider, MD    Family History Family History  Problem Relation Age of Onset  . Hypertension Other     Social History Social History  Substance Use Topics  . Smoking status: Never Smoker  . Smokeless tobacco: Never Used  . Alcohol use No     Allergies   Review of patient's allergies indicates no known allergies.   Review of Systems Review of Systems  Musculoskeletal: Negative  for arthralgias and myalgias.  Neurological: Negative for syncope.  All other systems reviewed and are negative.    Physical Exam Updated Vital Signs BP 133/89 (BP Location: Right Arm)   Pulse 67   Temp 97 F (36.1 C) (Oral)   Resp 18   SpO2 100%   Physical Exam  Constitutional: She appears well-developed and well-nourished.  Non-toxic appearance. She does not have a sickly appearance. She does not appear ill.  Frail, elderly appearing female.  HENT:  Head: Normocephalic and atraumatic.  Mouth/Throat: Oropharynx is clear and moist.  Eyes: Conjunctivae are normal.  Neck: Normal range of motion. Neck supple.  Cardiovascular: Normal rate, regular rhythm and normal heart sounds.   Pulmonary/Chest: Effort normal and breath sounds normal. No accessory muscle usage or stridor. No respiratory distress. She has no wheezes. She has no rhonchi. She has no rales.  Abdominal: Soft. Bowel sounds are normal. She exhibits no distension. There is no tenderness.  Musculoskeletal: Normal range of motion. She exhibits no tenderness.  No c/t/l/s midline tenderness.  No bony tenderness throughout upper and lower extremities.  Pelvis stable. No deformities or signs of trauma.   Lymphadenopathy:    She has no cervical adenopathy.  Neurological: She is alert.  Mumbles intermittently.   Skin: Skin is warm and dry.  Psychiatric: She has a normal mood and affect. Her behavior is normal.     ED Treatments / Results   DIAGNOSTIC STUDIES: Oxygen Saturation is 100% on RA, normal by my interpretation.    COORDINATION OF CARE: 8:11 PM Discussed treatment plan with pt at bedside and pt agreed to plan.   Labs (all labs ordered are listed, but only abnormal results are displayed) Labs Reviewed - No data to display  EKG  EKG Interpretation None       Radiology No results found.  Procedures Procedures (including critical care time)  Medications Ordered in ED Medications - No data to  display   Initial Impression / Assessment and Plan / ED Course  I have reviewed the triage vital signs and the nursing notes.  Pertinent labs & imaging results that were available during my care of the patient were reviewed by me and considered in my medical decision making (see chart for details).  Clinical Course   Patient presents with witnessed mechanical fall earlier this afternoon.  VSS, NAD.  No signs of trauma on exam.  Per daughter, patient has been in her usual state of health without complaints.  She is at her baseline.  No indication for further imaging or labs at this time.  Discussed this with the daughter who agrees with the above plan.  Return precautions discussed.  Stable for discharge.  Case has been discussed with Dr. Clydene PughKnott who agrees with the above plan for discharge.    Final Clinical Impressions(s) / ED Diagnoses   Final diagnoses:  Fall, initial encounter    New Prescriptions New Prescriptions   No medications on file   I personally  performed the services described in this documentation, which was scribed in my presence. The recorded information has been reviewed and is accurate.     Cheri Fowler, PA-C 11/05/15 2105    Lyndal Pulley, MD 11/06/15 [phone]

## 2015-11-05 NOTE — ED Notes (Signed)
Bed: WA17 Expected date:  Expected time:  Means of arrival:  Comments: Witnessed fall, 82 yr old

## 2015-11-17 ENCOUNTER — Inpatient Hospital Stay (HOSPITAL_COMMUNITY)
Admission: EM | Admit: 2015-11-17 | Discharge: 2015-11-20 | DRG: 683 | Disposition: A | Discharge status: Hospice | Payer: Medicare Other | Attending: Nephrology | Admitting: Nephrology

## 2015-11-17 ENCOUNTER — Emergency Department (HOSPITAL_COMMUNITY): Payer: Medicare Other

## 2015-11-17 ENCOUNTER — Encounter (HOSPITAL_COMMUNITY): Payer: Self-pay | Admitting: Orthopedic Surgery

## 2015-11-17 DIAGNOSIS — R319 Hematuria, unspecified: Secondary | ICD-10-CM

## 2015-11-17 DIAGNOSIS — Z79899 Other long term (current) drug therapy: Secondary | ICD-10-CM | POA: Diagnosis not present

## 2015-11-17 DIAGNOSIS — I872 Venous insufficiency (chronic) (peripheral): Secondary | ICD-10-CM | POA: Diagnosis present

## 2015-11-17 DIAGNOSIS — E870 Hyperosmolality and hypernatremia: Secondary | ICD-10-CM | POA: Diagnosis present

## 2015-11-17 DIAGNOSIS — G9340 Encephalopathy, unspecified: Secondary | ICD-10-CM

## 2015-11-17 DIAGNOSIS — F0281 Dementia in other diseases classified elsewhere with behavioral disturbance: Secondary | ICD-10-CM | POA: Diagnosis present

## 2015-11-17 DIAGNOSIS — E871 Hypo-osmolality and hyponatremia: Secondary | ICD-10-CM | POA: Diagnosis present

## 2015-11-17 DIAGNOSIS — Z515 Encounter for palliative care: Secondary | ICD-10-CM | POA: Diagnosis not present

## 2015-11-17 DIAGNOSIS — F05 Delirium due to known physiological condition: Secondary | ICD-10-CM | POA: Diagnosis present

## 2015-11-17 DIAGNOSIS — Z79891 Long term (current) use of opiate analgesic: Secondary | ICD-10-CM

## 2015-11-17 DIAGNOSIS — S0083XA Contusion of other part of head, initial encounter: Secondary | ICD-10-CM | POA: Diagnosis present

## 2015-11-17 DIAGNOSIS — W19XXXA Unspecified fall, initial encounter: Secondary | ICD-10-CM | POA: Diagnosis present

## 2015-11-17 DIAGNOSIS — F0391 Unspecified dementia with behavioral disturbance: Secondary | ICD-10-CM | POA: Diagnosis not present

## 2015-11-17 DIAGNOSIS — N183 Chronic kidney disease, stage 3 (moderate): Secondary | ICD-10-CM | POA: Diagnosis present

## 2015-11-17 DIAGNOSIS — N179 Acute kidney failure, unspecified: Principal | ICD-10-CM | POA: Diagnosis present

## 2015-11-17 DIAGNOSIS — B9689 Other specified bacterial agents as the cause of diseases classified elsewhere: Secondary | ICD-10-CM | POA: Diagnosis present

## 2015-11-17 DIAGNOSIS — B952 Enterococcus as the cause of diseases classified elsewhere: Secondary | ICD-10-CM | POA: Diagnosis present

## 2015-11-17 DIAGNOSIS — N189 Chronic kidney disease, unspecified: Secondary | ICD-10-CM

## 2015-11-17 DIAGNOSIS — Z66 Do not resuscitate: Secondary | ICD-10-CM | POA: Diagnosis present

## 2015-11-17 DIAGNOSIS — N3000 Acute cystitis without hematuria: Secondary | ICD-10-CM

## 2015-11-17 DIAGNOSIS — N390 Urinary tract infection, site not specified: Secondary | ICD-10-CM

## 2015-11-17 DIAGNOSIS — I4581 Long QT syndrome: Secondary | ICD-10-CM | POA: Diagnosis present

## 2015-11-17 DIAGNOSIS — E860 Dehydration: Secondary | ICD-10-CM | POA: Diagnosis present

## 2015-11-17 DIAGNOSIS — G309 Alzheimer's disease, unspecified: Secondary | ICD-10-CM | POA: Diagnosis present

## 2015-11-17 DIAGNOSIS — F03918 Unspecified dementia, unspecified severity, with other behavioral disturbance: Secondary | ICD-10-CM

## 2015-11-17 DIAGNOSIS — Z7189 Other specified counseling: Secondary | ICD-10-CM | POA: Diagnosis not present

## 2015-11-17 HISTORY — DX: Unspecified fall, initial encounter: W19.XXXA

## 2015-11-17 HISTORY — DX: Chronic kidney disease, unspecified: N18.9

## 2015-11-17 LAB — CBC WITH DIFFERENTIAL/PLATELET
BASOS ABS: 0 10*3/uL (ref 0.0–0.1)
Basophils Relative: 0 %
EOS PCT: 1 %
Eosinophils Absolute: 0.1 10*3/uL (ref 0.0–0.7)
HEMATOCRIT: 38.6 % (ref 36.0–46.0)
Hemoglobin: 12.7 g/dL (ref 12.0–15.0)
LYMPHS ABS: 1.5 10*3/uL (ref 0.7–4.0)
LYMPHS PCT: 19 %
MCH: 27.5 pg (ref 26.0–34.0)
MCHC: 32.9 g/dL (ref 30.0–36.0)
MCV: 83.5 fL (ref 78.0–100.0)
MONO ABS: 0.7 10*3/uL (ref 0.1–1.0)
Monocytes Relative: 9 %
NEUTROS ABS: 5.8 10*3/uL (ref 1.7–7.7)
Neutrophils Relative %: 72 %
PLATELETS: 184 10*3/uL (ref 150–400)
RBC: 4.62 MIL/uL (ref 3.87–5.11)
RDW: 17.1 % — AB (ref 11.5–15.5)
WBC: 8.1 10*3/uL (ref 4.0–10.5)

## 2015-11-17 LAB — BASIC METABOLIC PANEL
BUN: 65 mg/dL — AB (ref 6–20)
CO2: 22 mmol/L (ref 22–32)
Calcium: 9.2 mg/dL (ref 8.9–10.3)
Chloride: >130 mmol/L (ref 101–111)
Creatinine, Ser: 1.89 mg/dL — ABNORMAL HIGH (ref 0.44–1.00)
GFR calc Af Amer: 26 mL/min — ABNORMAL LOW (ref >60)
GFR calc non Af Amer: 23 mL/min — ABNORMAL LOW (ref >60)
GLUCOSE: 88 mg/dL (ref 65–99)
POTASSIUM: 4.4 mmol/L (ref 3.5–5.1)
Sodium: 166 mmol/L (ref 135–145)

## 2015-11-17 LAB — URINE MICROSCOPIC-ADD ON

## 2015-11-17 LAB — URINALYSIS, ROUTINE W REFLEX MICROSCOPIC
Bilirubin Urine: NEGATIVE
GLUCOSE, UA: NEGATIVE mg/dL
Ketones, ur: NEGATIVE mg/dL
NITRITE: NEGATIVE
PH: 6 (ref 5.0–8.0)
Protein, ur: 30 mg/dL — AB
SPECIFIC GRAVITY, URINE: 1.015 (ref 1.005–1.030)

## 2015-11-17 LAB — CK: Total CK: 304 U/L — ABNORMAL HIGH (ref 38–234)

## 2015-11-17 LAB — MRSA PCR SCREENING: MRSA BY PCR: POSITIVE — AB

## 2015-11-17 MED ORDER — CHLORHEXIDINE GLUCONATE CLOTH 2 % EX PADS
6.0000 | MEDICATED_PAD | Freq: Every day | CUTANEOUS | Status: DC
  Administered 2015-11-18 – 2015-11-20 (×3): 6 via TOPICAL

## 2015-11-17 MED ORDER — DEXTROSE 5 % IV SOLN
1.0000 g | INTRAVENOUS | Status: DC
  Filled 2015-11-17: qty 10

## 2015-11-17 MED ORDER — ENOXAPARIN SODIUM 30 MG/0.3ML ~~LOC~~ SOLN: 30.0000 mg | SUBCUTANEOUS | Status: DC

## 2015-11-17 MED ORDER — DEXTROSE 5 % IV SOLN
1.0000 g | INTRAVENOUS | Status: DC
  Administered 2015-11-18: 1 g via INTRAVENOUS
  Filled 2015-11-17 (×2): qty 10

## 2015-11-17 MED ORDER — SODIUM CHLORIDE 0.9 % IV SOLN
Freq: Once | INTRAVENOUS | Status: AC
  Administered 2015-11-17: 100 mL via INTRAVENOUS

## 2015-11-17 MED ORDER — ORAL CARE MOUTH RINSE
15.0000 mL | Freq: Two times a day (BID) | OROMUCOSAL | Status: DC
  Administered 2015-11-18 – 2015-11-20 (×5): 15 mL via OROMUCOSAL

## 2015-11-17 MED ORDER — LORAZEPAM 0.5 MG PO TABS
0.5000 mg | ORAL_TABLET | Freq: Three times a day (TID) | ORAL | Status: DC | PRN
  Administered 2015-11-18: 0.5 mg via ORAL
  Filled 2015-11-17: qty 1

## 2015-11-17 MED ORDER — DEXTROSE 5 % IV SOLN
1.0000 g | Freq: Once | INTRAVENOUS | Status: AC
  Administered 2015-11-17: 1 g via INTRAVENOUS
  Filled 2015-11-17: qty 10

## 2015-11-17 MED ORDER — LORAZEPAM 0.5 MG PO TABS
0.2500 mg | ORAL_TABLET | Freq: Two times a day (BID) | ORAL | Status: DC
  Administered 2015-11-17 – 2015-11-20 (×6): 0.25 mg via ORAL
  Filled 2015-11-17 (×6): qty 1

## 2015-11-17 MED ORDER — DEXTROSE 5 % IV SOLN
Freq: Once | INTRAVENOUS | Status: AC
  Administered 2015-11-17: 16:00:00 via INTRAVENOUS

## 2015-11-17 MED ORDER — TOBRAMYCIN-DEXAMETHASONE 0.3-0.1 % OP SUSP: 2.0000 [drp] | Freq: Two times a day (BID) | OPHTHALMIC | Status: DC

## 2015-11-17 MED ORDER — SODIUM CHLORIDE 0.9 % IV BOLUS (SEPSIS)
250.0000 mL | Freq: Once | INTRAVENOUS | Status: AC
  Administered 2015-11-17: 250 mL via INTRAVENOUS

## 2015-11-17 MED ORDER — MUPIROCIN 2 % EX OINT
1.0000 "application " | TOPICAL_OINTMENT | Freq: Two times a day (BID) | CUTANEOUS | Status: DC
  Administered 2015-11-18 – 2015-11-20 (×5): 1 via NASAL
  Filled 2015-11-17 (×2): qty 22

## 2015-11-17 NOTE — Progress Notes (Addendum)
[facility], PCR results. Nurse paged provider, Opyd, to make provider aware of positive results.   Opyd returned call to nurse. Per Opyd, call Jegede. Nurse paged Hyman HopesJegede. Jegede returned call to nurse and requested nurse to place patient on contact precautions. Nurse will order contact precautions.

## 2015-11-17 NOTE — ED Notes (Signed)
Bed: WA04 Expected date:  Expected time:  Means of arrival:  Comments: 82 yo F/ FALL

## 2015-11-17 NOTE — ED Notes (Addendum)
Patient transported to CT unable to collect labs

## 2015-11-17 NOTE — ED Triage Notes (Signed)
[facility] and was found down on the floor in front of bathroom w/hematoma on RT side of forehead.  Per staff, she's at her baseline mentation w/unknown downtime but EMS states she was warm when they arrived there. VSS. Not on blood thinners.

## 2015-11-17 NOTE — H&P (Addendum)
[facility] History and Physical  Meredith Leedsnn J Whobrey ZDG:[id number] DOB: 1925-07-18 DOA: 11/17/2015  Referring physician: Fayrene FearingJames ED PCP: Florentina JennyRIPP, HENRY, MD  Specialists: none  Chief Complaint:   HPI:  Unfortunate 82 -year-old female known ALF resident for the past 3 years Known severe dementia with behavioral disturbances on treatment  She has been brought to the emergency room 4 times in the past month with falls and other various issues She presented from ALF 11/17/2015 with a fall and hematoma on her right temple She was found to have a sodium 166 and a chloride of 1:30 in the setting of acute renal insufficiency BUN/65/1.8 compared to prior of 28/1.01  She was initially very lethargic and unable to verbalize however has become more agitated now  In addition was found to have turbid urine She is unable to give me a history At baseline she is able to ambulate unsteadily and is moreCohesive but cannot really remember who her relatives are Had a long discussion with her daughter and the room   Past Medical History:  Diagnosis Date  . Dementia    Past Surgical History:  Procedure Laterality Date  . ABDOMINAL HYSTERECTOMY     Social History:  Social History   Social History Narrative  . No narrative on file    No Known Allergies  Family History  Problem Relation Age of Onset  . Hypertension Other     Prior to Admission medications   Medication Sig Start Date End Date Taking? Authorizing Provider  acetaminophen (TYLENOL) 500 MG tablet Take 1,000 mg by mouth 3 (three) times daily.   Yes Historical Provider, MD  cholecalciferol (VITAMIN D) 1000 units tablet Take 1,000 Units by mouth daily with breakfast.   Yes Historical Provider, MD  Emollient (CERAVE) CREA Apply 1 application topically 3 (three) times daily. Apply to bilateral lower extremities   Yes Historical Provider, MD  LORazepam (ATIVAN) 0.5 MG tablet Take 0.25 mg by mouth every 12 (twelve) hours.   Yes  Historical Provider, MD  LORazepam (ATIVAN) 0.5 MG tablet Take 0.5 mg by mouth every 8 (eight) hours as needed for anxiety. agitation   Yes Historical Provider, MD  memantine (NAMENDA) 5 MG tablet Take 5 mg by mouth daily.    Yes Historical Provider, MD  Nutritional Supplements (NUTRITIONAL DRINK) LIQD Take 1 Can by mouth 3 (three) times daily. *Mighty Shakes*   Yes Historical Provider, MD  oxyCODONE-acetaminophen (PERCOCET/ROXICET) 5-325 MG tablet Take 1 tablet by mouth every 4 (four) hours as needed for severe pain.   Yes Historical Provider, MD  senna (SENOKOT) 8.6 MG tablet Take 1 tablet by mouth daily.   Yes Historical Provider, MD  Skin Protectants, Misc. (BAZA PROTECT EX) Apply 1 application topically 2 (two) times daily. Apply to buttocks after each incontinence episode   Yes Historical Provider, MD  tobramycin-dexamethasone [facility](TOBRADEX) ophthalmic solution Place 2 drops into the left eye 2 (two) times daily. For 7 days 11/08/15  Yes Historical Provider, MD  triamcinolone (KENALOG) 0.025 % cream Apply 1 application topically 2 (two) times daily as needed (for itching feet). Apply to the left or right foot   Yes Historical Provider, MD   Physical Exam: Vitals:   11/17/15 0726 11/17/15 0744 11/17/15 1012 11/17/15 1208  BP:  107/70 131/90 113/67  Pulse:  64 66 65  Resp:  18 18 14   Temp:  97.6 F (36.4 C) 97.8 F (36.6 C) 97.4 F (36.3 C)  TempSrc:  Oral Oral Oral  SpO2: 94%  98% 100% 98%    Frail cachectic African-American female right temporal hematoma Uncooperative with exam fighting this examiner chest is clinically clear slightly tach cardiac by temporalis wasting supraclavicularly wasting Chest is clinically clear cannot examine lower extremities but do not appear swollen  Labs on Admission:  Basic Metabolic Panel:  Recent Labs Lab 11/17/15 0848  NA 166*  K 4.4  CL >130*  CO2 22  GLUCOSE 88  BUN 65*  CREATININE 1.89*  CALCIUM 9.2   Liver Function Tests: No results  for input(s): AST, ALT, ALKPHOS, BILITOT, PROT, ALBUMIN in the last 168 hours. No results for input(s): LIPASE, AMYLASE in the last 168 hours. No results for input(s): AMMONIA in the last 168 hours. CBC:  Recent Labs Lab 11/17/15 0848  WBC 8.1  NEUTROABS 5.8  HGB 12.7  HCT 38.6  MCV 83.5  PLT 184   Cardiac Enzymes:  Recent Labs Lab 11/17/15 0848  CKTOTAL 304*    BNP (last 3 results) No results for input(s): BNP in the last 8760 hours.  ProBNP (last 3 results) No results for input(s): PROBNP in the last 8760 hours.  CBG: No results for input(s): GLUCAP in the last 168 hours.  Radiological Exams on Admission: Ct Head Wo Contrast  Result Date: 11/17/2015 CLINICAL DATA:  Dementia.  Fall with trauma to the head and neck. EXAM: CT HEAD WITHOUT CONTRAST CT CERVICAL SPINE WITHOUT CONTRAST TECHNIQUE: Multidetector CT imaging of the head and cervical spine was performed following the standard protocol without intravenous contrast. Multiplanar CT image reconstructions of the cervical spine were also generated. COMPARISON:  04/01/2015 FINDINGS: CT HEAD FINDINGS Brain: Generalized atrophy not temporal lobe predominant. No sign of acute infarction, mass lesion, hemorrhage hydrocephalus or extra-axial collection. Chronic small-vessel ischemic changes affecting the white matter and basal ganglia regions. Vascular: There is atherosclerotic calcification of the major vessels at the base of the brain. Skull: No skull fracture. Sinuses/Orbits: Clear/normal Other: Right frontal scalp hematoma. CT CERVICAL SPINE FINDINGS Alignment: Straightening of the normal cervical lordosis. 3 mm anterolisthesis C4-5. 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 Soft tissues and spinal canal: Negative Disc levels:  Ordinary osteoarthritis C1-2. Facet arthropathy right more than left at C2-3. Bilateral facet arthropathy C3-4. Bilateral facet arthropathy C4-5 worse on the left. Foraminal stenosis  on the left because of osteophytic encroachment. 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  No stenosis. Degenerative spondylosis at C6-7 with mild foraminal narrowing bilaterally. Chronic degenerative spondylosis at C7-T1 without bony stenosis. Upper chest: Emphysema and scarring. Other: None significant IMPRESSION: Head CT: Right frontal scalp hematoma. No skull fracture. No intracranial injury. Atrophy and chronic small vessel disease. Cervical spine CT: No acute or traumatic finding. 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 Chronic degenerative changes above and below that. Electronically Signed   By: Paulina Fusi M.D.   On: 11/17/2015 08:57   Ct Cervical Spine Wo Contrast  Result Date: 11/17/2015 CLINICAL DATA:  Dementia.  Fall with trauma to the head and neck. EXAM: CT HEAD WITHOUT CONTRAST CT CERVICAL SPINE WITHOUT CONTRAST TECHNIQUE: Multidetector CT imaging of the head and cervical spine was performed following the standard protocol without intravenous contrast. Multiplanar CT image reconstructions of the cervical spine were also generated. COMPARISON:  04/01/2015 FINDINGS: CT HEAD FINDINGS Brain: Generalized atrophy not temporal lobe predominant. No sign of acute infarction, mass lesion, hemorrhage hydrocephalus or extra-axial collection. Chronic small-vessel ischemic changes affecting the white matter and basal ganglia regions. Vascular: There is atherosclerotic calcification of the major vessels at the base  of the brain. Skull: No skull fracture. Sinuses/Orbits: Clear/normal Other: Right frontal scalp hematoma. CT CERVICAL SPINE FINDINGS Alignment: Straightening of the normal cervical lordosis. 3 mm anterolisthesis C4-5. 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 Soft tissues and spinal canal: Negative Disc levels:  Ordinary osteoarthritis C1-2. Facet arthropathy right more than left at C2-3. Bilateral facet arthropathy C3-4. Bilateral facet arthropathy C4-5 worse on the left. Foraminal stenosis on the  left because of osteophytic encroachment. 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  No stenosis. Degenerative spondylosis at C6-7 with mild foraminal narrowing bilaterally. Chronic degenerative spondylosis at C7-T1 without bony stenosis. Upper chest: Emphysema and scarring. Other: None significant IMPRESSION: Head CT: Right frontal scalp hematoma. No skull fracture. No intracranial injury. Atrophy and chronic small vessel disease. Cervical spine CT: No acute or traumatic finding. 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 Chronic degenerative changes above and below that. Electronically Signed   By: Paulina FusiMark  Shogry M.D.   On: 11/17/2015 08:57    EKG: Independently reviewed. artifact probably secondary to movement and essentially unreadable  Assessment/Plan  Severe hypernatremia Her free water deficit is 3.5 L We will start her with IV dextrose at 75 cc an hour  Her deficit should the resolved in 46-48 hours based on calculations I had a long discussion with her daughter and mentions that I do think she would doubly not get better durability from this with recurrent falls and I will consult palliative care to see her again  UTI Cont Rocephin UA confirmed to be from I/o specimen  AKI Gentle D5 Encourage fluids  Rtemporal hematoma Monitor therapy eval Might need SNF level or memory care  Delirium 2/2to #1 and#2 revisited room later and patient actually sitting up and not agitated Will start diet   DNR Inpatient D/wdaughte rin detail  Pager [phone]  If 7PM-7AM, please contact night-coverage [email or web address] Password [email or web address] 11/17/2015, 1:24 PM

## 2015-11-17 NOTE — ED Provider Notes (Signed)
[facility]-EMERGENCY DEPT Provider Note   CSN: [id number] Arrival date & time: 11/17/15  0725     History   Chief Complaint Chief Complaint  Patient presents with  . Fall    HPI Shelley Fowler is a 82 y.o. female. She resides at a skilled nursing extended care facility. Was found by staff in her room on the floor this morning. Unknown how long the patient was on the floor. No signs of trauma from paramedics. Nonverbal at baseline. History of dementia.  HPI  Past Medical History:  Diagnosis Date  . CKD (chronic kidney disease)    Stage III  . Dementia   . Fall     Patient Active Problem List   Diagnosis Date Noted  . Chronic hypernatremia   . Acute renal failure superimposed on chronic kidney disease (HCC)   . Acute cystitis without hematuria   . Dementia with behavioral disturbance   . Hyponatremia 11/17/2015  . Acute hypernatremia 11/17/2015  . Palliative care encounter 04/03/2015  . DNR (do not resuscitate) 04/03/2015  . Pressure ulcer 04/02/2015  . Protein-calorie malnutrition, severe 04/02/2015  . Hypernatremia 04/01/2015  . Acute encephalopathy 04/01/2015  . Cellulitis of leg, left 07/22/2012  . Alzheimer's disease 07/22/2012    Past Surgical History:  Procedure Laterality Date  . ABDOMINAL HYSTERECTOMY      OB History    No data available       Home Medications    Prior to Admission medications   Medication Sig Start Date End Date Taking? Authorizing Provider  acetaminophen (TYLENOL) 500 MG tablet Take 1,000 mg by mouth 3 (three) times daily.   Yes Historical Provider, MD  cholecalciferol (VITAMIN D) 1000 units tablet Take 1,000 Units by mouth daily with breakfast.   Yes Historical Provider, MD  Emollient (CERAVE) CREA Apply 1 application topically 3 (three) times daily. Apply to bilateral lower extremities   Yes Historical Provider, MD  LORazepam (ATIVAN) 0.5 MG tablet Take 0.25 mg by mouth every 12 (twelve) hours.   Yes Historical Provider, MD    LORazepam (ATIVAN) 0.5 MG tablet Take 0.5 mg by mouth every 8 (eight) hours as needed for anxiety. agitation   Yes Historical Provider, MD  memantine (NAMENDA) 5 MG tablet Take 5 mg by mouth daily.    Yes Historical Provider, MD  Nutritional Supplements (NUTRITIONAL DRINK) LIQD Take 1 Can by mouth 3 (three) times daily. *Mighty Shakes*   Yes Historical Provider, MD  oxyCODONE-acetaminophen (PERCOCET/ROXICET) 5-325 MG tablet Take 1 tablet by mouth every 4 (four) hours as needed for severe pain.   Yes Historical Provider, MD  senna (SENOKOT) 8.6 MG tablet Take 1 tablet by mouth daily.   Yes Historical Provider, MD  Skin Protectants, Misc. (BAZA PROTECT EX) Apply 1 application topically 2 (two) times daily. Apply to buttocks after each incontinence episode   Yes Historical Provider, MD  tobramycin-dexamethasone [facility](TOBRADEX) ophthalmic solution Place 2 drops into the left eye 2 (two) times daily. For 7 days 11/08/15  Yes Historical Provider, MD  triamcinolone (KENALOG) 0.025 % cream Apply 1 application topically 2 (two) times daily as needed (for itching feet). Apply to the left or right foot   Yes Historical Provider, MD    Family History Family History  Problem Relation Age of Onset  . Hypertension Other     Social History Social History  Substance Use Topics  . Smoking status: Never Smoker  . Smokeless tobacco: Never Used  . Alcohol use No  Allergies   Patient has no known allergies.   Review of Systems Review of Systems  Unable to perform ROS: Patient nonverbal   level V caveat   Physical Exam Updated Vital Signs BP (!) 91/58 (BP Location: Right Arm)   Pulse 65   Temp 98.2 F (36.8 C) (Oral)   Resp (!) 21   Ht 5\' 1"  (1.549 m)   Wt 87 lb 11.9 oz (39.8 kg)   SpO2 100%   BMI 16.58 kg/m   Physical Exam  Constitutional: No distress.  Thin Frail-appearing  HENT:  Head: Normocephalic.  Eyes: Conjunctivae are normal. Pupils are equal, round, and reactive to light. No  scleral icterus.  Neck: Normal range of motion. Neck supple. No thyromegaly present.  Cardiovascular: Normal rate and regular rhythm.  Exam reveals no gallop and no friction rub.   No murmur heard. Pulmonary/Chest: Effort normal and breath sounds normal. No respiratory distress. She has no wheezes. She has no rales.  Abdominal: Soft. Bowel sounds are normal. She exhibits no distension. There is no tenderness. There is no rebound.  Musculoskeletal: Normal range of motion.  No current discomfort with range of motion of extremities. Nontender over the hips bilaterally.  Neurological: She is alert.  Nonverbal.  Skin: Skin is warm and dry. No rash noted.  Psychiatric: She has a normal mood and affect. Her behavior is normal.     ED Treatments / Results  Labs (all labs ordered are listed, but only abnormal results are displayed) Labs Reviewed  URINE CULTURE - Abnormal; Notable for the following:       Result Value   Culture >=100,000 COLONIES/mL ENTEROCOCCUS FAECIUM (*)    Organism ID, Bacteria ENTEROCOCCUS FAECIUM (*)    All other components within normal limits  MRSA PCR SCREENING - Abnormal; Notable for the following:    MRSA by PCR POSITIVE (*)    All other components within normal limits  CBC WITH DIFFERENTIAL/PLATELET - Abnormal; Notable for the following:    RDW 17.1 (*)    All other components within normal limits  BASIC METABOLIC PANEL - Abnormal; Notable for the following:    Sodium 166 (*)    Chloride >130 (*)    BUN 65 (*)    Creatinine, Ser 1.89 (*)    GFR calc non Af Amer 23 (*)    GFR calc Af Amer 26 (*)    All other components within normal limits  CK - Abnormal; Notable for the following:    Total CK 304 (*)    All other components within normal limits  URINALYSIS, ROUTINE W REFLEX MICROSCOPIC (NOT AT [facility]) - Abnormal; Notable for the following:    APPearance TURBID (*)    Hgb urine dipstick MODERATE (*)    Protein, ur 30 (*)    Leukocytes, UA LARGE (*)    All  other components within normal limits  URINE MICROSCOPIC-ADD ON - Abnormal; Notable for the following:    Squamous Epithelial / LPF 0-5 (*)    Bacteria, UA MANY (*)    All other components within normal limits  BASIC METABOLIC PANEL - Abnormal; Notable for the following:    Sodium 163 (*)    Chloride >130 (*)    Glucose, Bld 100 (*)    BUN 50 (*)    Creatinine, Ser 1.51 (*)    Calcium 8.7 (*)    GFR calc non Af Amer 30 (*)    GFR calc Af Amer 34 (*)  All other components within normal limits  BASIC METABOLIC PANEL - Abnormal; Notable for the following:    Sodium 158 (*)    Chloride >130 (*)    Glucose, Bld 123 (*)    BUN 41 (*)    Creatinine, Ser 1.56 (*)    Calcium 8.4 (*)    GFR calc non Af Amer 29 (*)    GFR calc Af Amer 33 (*)    All other components within normal limits  BASIC METABOLIC PANEL - Abnormal; Notable for the following:    Sodium 154 (*)    Chloride 124 (*)    Glucose, Bld 115 (*)    BUN 33 (*)    Creatinine, Ser 1.17 (*)    Calcium 8.5 (*)    GFR calc non Af Amer 40 (*)    GFR calc Af Amer 47 (*)    All other components within normal limits  BASIC METABOLIC PANEL - Abnormal; Notable for the following:    Potassium 3.0 (*)    Chloride 115 (*)    Glucose, Bld 149 (*)    BUN 26 (*)    Creatinine, Ser 1.23 (*)    Calcium 8.0 (*)    GFR calc non Af Amer 38 (*)    GFR calc Af Amer 44 (*)    Anion gap 4 (*)    All other components within normal limits  BASIC METABOLIC PANEL - Abnormal; Notable for the following:    Sodium 147 (*)    Chloride 119 (*)    BUN 21 (*)    Creatinine, Ser 1.06 (*)    Calcium 8.2 (*)    GFR calc non Af Amer 45 (*)    GFR calc Af Amer 53 (*)    Anion gap 3 (*)    All other components within normal limits    EKG  EKG Interpretation  Date/Time:  Saturday November 17 2015 11:46:27 EST Ventricular Rate:  73 PR Interval:    QRS Duration: 75 QT Interval:  514 QTC Calculation: 567 R Axis:   82 Text Interpretation:   Sinus rhythm Atrial premature complex Short PR interval Right atrial enlargement Probable lateral infarct, age indeterminate Abnrm T, consider ischemia, anterolateral lds Borderline ST elevation, anterior leads Prolonged QT interval Artifact in lead(s) I II III aVR aVL aVF V1 V2 V6 and baseline wander in lead(s) I II III aVR aVF V1 V2 V3 V4 V5 V6 Confirmed by BELFI  MD, MELANIE ([id number]) on 11/18/2015 6:03:43 PM       Radiology No results found.  Procedures Procedures (including critical care time)  Medications Ordered in ED Medications  0.9 %  sodium chloride infusion ( Intravenous Transfusing/Transfer 11/17/15 1452)  sodium chloride 0.9 % bolus 250 mL (0 mLs Intravenous Stopped 11/17/15 1338)  cefTRIAXone (ROCEPHIN) 1 g in dextrose 5 % 50 mL IVPB (0 g Intravenous Stopped 11/17/15 1447)  dextrose 5 % solution ( Intravenous New Bag/Given 11/17/15 1555)  potassium chloride SA (K-DUR,KLOR-CON) CR tablet 40 mEq (40 mEq Oral Given 11/19/15 2035)     Initial Impression / Assessment and Plan / ED Course  I have reviewed the triage vital signs and the nursing notes.  Pertinent labs & imaging results that were available during my care of the patient were reviewed by me and considered in my medical decision making (see chart for details).  Clinical Course    Patient started on fluids, antibiotics, discussed with Dr. Mahala MenghiniSamtani. Will be admitted.   Final Clinical  Impressions(s) / ED Diagnoses   Final diagnoses:  Urinary tract infection with hematuria, site unspecified  Encephalopathy acute  Hyponatremia  AKI (acute kidney injury) (HCC)  Dementia with behavioral disturbance, unspecified dementia type  DNR (do not resuscitate)    New Prescriptions Discharge Medication List as of 11/20/2015  5:52 PM    START taking these medications   Details  ampicillin (PRINCIPEN) 500 MG capsule Take 1 capsule (500 mg total) by mouth every 12 (twelve) hours., Starting Tue 11/20/2015, Until Sat  11/24/2015, Print         Rolland Porter, MD 12/03/15 2315

## 2015-11-18 DIAGNOSIS — N3000 Acute cystitis without hematuria: Secondary | ICD-10-CM

## 2015-11-18 DIAGNOSIS — Z515 Encounter for palliative care: Secondary | ICD-10-CM

## 2015-11-18 DIAGNOSIS — Z7189 Other specified counseling: Secondary | ICD-10-CM

## 2015-11-18 DIAGNOSIS — F03918 Unspecified dementia, unspecified severity, with other behavioral disturbance: Secondary | ICD-10-CM

## 2015-11-18 DIAGNOSIS — E870 Hyperosmolality and hypernatremia: Secondary | ICD-10-CM

## 2015-11-18 DIAGNOSIS — N179 Acute kidney failure, unspecified: Principal | ICD-10-CM

## 2015-11-18 DIAGNOSIS — F0391 Unspecified dementia with behavioral disturbance: Secondary | ICD-10-CM

## 2015-11-18 DIAGNOSIS — N189 Chronic kidney disease, unspecified: Secondary | ICD-10-CM

## 2015-11-18 LAB — BASIC METABOLIC PANEL
BUN: 50 mg/dL — ABNORMAL HIGH (ref 6–20)
BUN: 41 mg/dL — ABNORMAL HIGH (ref 6–20)
CALCIUM: 8.4 mg/dL — AB (ref 8.9–10.3)
CO2: 23 mmol/L (ref 22–32)
CO2: 25 mmol/L (ref 22–32)
Calcium: 8.7 mg/dL — ABNORMAL LOW (ref 8.9–10.3)
Chloride: >130 mmol/L (ref 101–111)
Creatinine, Ser: 1.51 mg/dL — ABNORMAL HIGH (ref 0.44–1.00)
Creatinine, Ser: 1.56 mg/dL — ABNORMAL HIGH (ref 0.44–1.00)
GFR calc non Af Amer: 30 mL/min — ABNORMAL LOW (ref >60)
GFR, EST AFRICAN AMERICAN: 34 mL/min — AB (ref >60)
GFR, EST AFRICAN AMERICAN: 33 mL/min — AB (ref >60)
GFR, EST NON AFRICAN AMERICAN: 29 mL/min — AB (ref >60)
Glucose, Bld: 100 mg/dL — ABNORMAL HIGH (ref 65–99)
Glucose, Bld: 123 mg/dL — ABNORMAL HIGH (ref 65–99)
POTASSIUM: 3.5 mmol/L (ref 3.5–5.1)
Potassium: 4 mmol/L (ref 3.5–5.1)
SODIUM: 163 mmol/L — AB (ref 135–145)
Sodium: 158 mmol/L — ABNORMAL HIGH (ref 135–145)

## 2015-11-18 MED ORDER — MEMANTINE HCL 10 MG PO TABS
5.0000 mg | ORAL_TABLET | Freq: Every day | ORAL | Status: DC
  Administered 2015-11-18 – 2015-11-20 (×3): 5 mg via ORAL
  Filled 2015-11-18 (×3): qty 1

## 2015-11-18 MED ORDER — DEXTROSE 5 % IV SOLN
INTRAVENOUS | Status: DC
  Administered 2015-11-18 – 2015-11-19 (×4): via INTRAVENOUS

## 2015-11-18 NOTE — Progress Notes (Signed)
CRITICAL VALUE ALERT  Critical value received:  Chloride >130  Date of notification:  11/18/2015  Time of notification:  1726  Critical value read back:Yes.      Nurse who received alert:  Niel HummerWendy Korie Streat, RN  MD notified (1st page):  Dr. Ronalee BeltsBhandari  Time of first page: 1726  MD notified (2nd page):n/a  Time of second page:n/a  Responding MD:  Dr. Ronalee BeltsBhandari  Time MD responded:  [phone]

## 2015-11-18 NOTE — Plan of Care (Signed)
Problem: Education: Goal: Knowledge of [facility] Education information/materials will improve Outcome: Not Applicable Date Met: 38/32/91 Pt confused not verbal

## 2015-11-18 NOTE — Progress Notes (Signed)
Report called to U[employer] Banc[employer]wendy RN east  D Isaiah SergeFrankln

## 2015-11-18 NOTE — Evaluation (Signed)
Physical Therapy Evaluation Patient Details Name: Meredith Leedsnn J Romaniello MRN: [id number] DOB: July 31, 1925 Today's Date: 11/18/2015   History of Present Illness  Pt admitted through the ED s/p fall with R temporal hematoma and hx of dementia  Clinical Impression  Pt admitted as above and presenting with functional mobility limitations 2* balance deficits and severe dementia related cognitive deficits.  Pt will require follow up at SNF level.    Follow Up Recommendations SNF    Equipment Recommendations  None recommended by PT    Recommendations for Other Services       Precautions / Restrictions Precautions Precautions: Fall Restrictions Weight Bearing Restrictions: No      Mobility  Bed Mobility Overal bed mobility: Needs Assistance Bed Mobility: Supine to Sit     Supine to sit: Min assist     General bed mobility comments: Pt assisted with bringing LEs over EOB and followed tactile cues to move to upright sitting  Transfers Overall transfer level: Needs assistance Equipment used: None Transfers: Sit to/from [employer]Stand;Stand Pivot Transfers Sit to Stand: Mod assist;Max assist;+2 physical assistance;+2 safety/equipment Stand pivot transfers: Mod assist;Max assist;+2 physical assistance;+2 safety/equipment       General transfer comment: Pt not following VC but with tactile input assisted to move to feet and able to WB sufficiently to complete stand pvt bed<>chair with assist of 2  Ambulation/Gait             General Gait Details: Stand pvt only at this time.  Pt crossing legs and not following cues to allow safe attempt at ambulation  Stairs            Wheelchair Mobility    Modified Rankin (Stroke Patients Only)       Balance Overall balance assessment: Needs assistance Sitting-balance support: No upper extremity supported;Feet supported Sitting balance-Leahy Scale: Fair     Standing balance support: Bilateral upper extremity supported Standing  balance-Leahy Scale: Zero                               Pertinent Vitals/Pain Pain Assessment: Faces Pain Score: 0-No pain Faces Pain Scale: No hurt    Home Living Family/patient expects to be discharged to:: Skilled nursing facility                      Prior Function Level of Independence: Needs assistance   Gait / Transfers Assistance Needed: Pt unable to provide information  ADL's / Homemaking Assistance Needed: assistance  Comments: Pt unable to follow commands     Hand Dominance        Extremity/Trunk Assessment   Upper Extremity Assessment: Difficult to assess due to impaired cognition           Lower Extremity Assessment: Difficult to assess due to impaired cognition      Cervical / Trunk Assessment: Kyphotic  Communication   Communication: Other (comment) (severe dementia)  Cognition Arousal/Alertness: Awake/alert Behavior During Therapy: Impulsive;Restless Overall Cognitive Status: History of cognitive impairments - at baseline                      General Comments      Exercises     Assessment/Plan    PT Assessment Patient needs continued PT services  PT Problem List Decreased balance;Decreased mobility;Decreased cognition;Decreased strength;Decreased activity tolerance          PT Treatment Interventions DME instruction;Gait training;Functional mobility training;Therapeutic  activities;Therapeutic exercise;Balance training;Cognitive remediation    PT Goals (Current goals can be found in the Care Plan section)  Acute Rehab PT Goals Patient Stated Goal: Pt unable to state goal PT Goal Formulation: With patient Time For Goal Achievement: 12/01/15 Potential to Achieve Goals: Good    Frequency Min 2X/week   Barriers to discharge        Co-evaluation               End of Session Equipment Utilized During Treatment: Gait belt Activity Tolerance: Patient tolerated treatment well Patient left: in  bed;with bed alarm set;with call bell/phone within reach Nurse Communication: Mobility status         Time: 1052-1110 PT Time Calculation (min) (ACUTE ONLY): 18 min   Charges:   PT Evaluation $PT Eval Moderate Complexity: 1 Procedure     PT G Codes:        Maikel Neisler 11/18/2015, 12:48 PM

## 2015-11-18 NOTE — [place] FL2 (Addendum)
[place] MEDICAID FL2 LEVEL OF CARE SCREENING TOOL     IDENTIFICATION  Patient Name: Shelley Fowler J Dobler Birthdate: [birth year] Sex: female Admission Date (Current Location): 11/17/2015  [facility] [place]   Facility and Address:  [facility],  [address], [place] [id number]      Provider Number: [id number]  Attending Physician Name and Address:  Maxie Barbron Prasad Bhandari, MD  Relative Name and Phone Number:       Current Level of Care: Hospital Recommended Level of Care: Assisted Living Facility Special  Care Unit Prior Approval Number:    Date Approved/Denied:   PASRR Number:  Discharge Plan: Assisted Living Facility Special Care Unit    Current Diagnoses: Patient Active Problem List   Diagnosis Date Noted   . Alzheimer's disease   Chronic hypernatremia 0717/2014  . Acute renal failure superimposed on chronic kidney disease (HCC)   . Acute cystitis without hematuria   . Dementia with behavioral disturbance   . Hyponatremia 11/17/2015  . Acute hypernatremia 11/17/2015  . Palliative care encounter 04/03/2015  . DNR (do not resuscitate) 04/03/2015  . Pressure ulcer 04/02/2015  . Protein-calorie malnutrition, severe 04/02/2015  . Hypernatremia 04/01/2015  . Acute encephalopathy 04/01/2015  . Cellulitis of leg, left 07/22/2012  .      Orientation RESPIRATION BLADDER Height & Weight     Self  Normal Incontinent Weight: 87 lb 11.9 oz (39.8 kg) Height:  5\' 1"  (154.9 cm)  BEHAVIORAL SYMPTOMS/MOOD NEUROLOGICAL BOWEL NUTRITION STATUS  Other (Comment) (Dementia with behavioral disturbances, high risk for falls)   Incontinent Diet (Regular)  AMBULATORY STATUS COMMUNICATION OF NEEDS Skin   Extensive Assist Verbally (can speak but currently is not) Other (Comment) (dry flaky skin)                       Personal Care Assistance Level of Assistance  Bathing, Total care, Feeding, Dressing Bathing Assistance: Maximum assistance Feeding  assistance: Maximum assistance Dressing Assistance: Maximum assistance Total Care Assistance: Maximum assistance   Functional Limitations Info  Speech (currently will not speak)     Speech Info: Impaired    SPECIAL CARE FACTORS FREQUENCY  PT (By licensed PT), OT (By licensed OT)    PT:5              Contractures Contractures Info: Not present    Additional Factors Info  Code Status, Allergies Code Status Info: DNR Allergies Info: NKA           Current Medications (11/18/2015):  This is the current hospital active medication list Current Facility-Administered Medications  Medication Dose Route Frequency Provider Last Rate Last Dose  . cefTRIAXone (ROCEPHIN) 1 g in dextrose 5 % 50 mL IVPB  1 g Intravenous Q24H Rhetta MuraJai-Gurmukh Samtani, MD   1 g at 11/18/15 1509  . Chlorhexidine Gluconate Cloth 2 % PADS 6 each  6 each Topical Q0600 Quentin Angstlugbemiga E Jegede, MD   6 each at 11/18/15 0909  . dextrose 5 % solution   Intravenous Continuous Dron Jaynie CollinsPrasad Bhandari, MD 100 mL/hr at 11/18/15 1510    . LORazepam (ATIVAN) tablet 0.25 mg  0.25 mg Oral Q12H Rhetta MuraJai-Gurmukh Samtani, MD   0.25 mg at 11/18/15 0908  . LORazepam (ATIVAN) tablet 0.5 mg  0.5 mg Oral Q8H PRN Rhetta MuraJai-Gurmukh Samtani, MD   0.5 mg at 11/18/15 0612  . MEDLINE mouth rinse  15 mL Mouth Rinse BID Rhetta MuraJai-Gurmukh Samtani, MD   15 mL at 11/18/15 0909  .  memantine (NAMENDA) tablet 5 mg  5 mg Oral Daily Dron Jaynie CollinsPrasad Bhandari, MD   5 mg at 11/18/15 1509  . mupirocin ointment (BACTROBAN) 2 % 1 application  1 application Nasal BID Quentin Angstlugbemiga E Jegede, MD   1 application at 11/18/15 0910     Discharge Medications:  Medication List    TAKE these medications   acetaminophen 500 MG tablet Commonly known as:  TYLENOL Take 1,000 mg by mouth 3 (three) times daily.  ampicillin 500 MG capsule Commonly known as:  PRINCIPEN Take 1 capsule (500 mg total) by mouth every 12 (twelve) hours.  BAZA PROTECT EX Apply 1 application topically 2 (two) times  daily. Apply to buttocks after each incontinence episode  CERAVE Crea Apply 1 application topically 3 (three) times daily. Apply to bilateral lower extremities  cholecalciferol 1000 units tablet Commonly known as:  VITAMIN D Take 1,000 Units by mouth daily with breakfast.  LORazepam 0.5 MG tablet Commonly known as:  ATIVAN Take 0.25 mg by mouth every 12 (twelve) hours.  LORazepam 0.5 MG tablet Commonly known as:  ATIVAN Take 0.5 mg by mouth every 8 (eight) hours as needed for anxiety. agitation  memantine 5 MG tablet Commonly known as:  NAMENDA Take 5 mg by mouth daily.  NUTRITIONAL DRINK Liqd Take 1 Can by mouth 3 (three) times daily. *Mighty Shakes*  oxyCODONE-acetaminophen 5-325 MG tablet Commonly known as:  PERCOCET/ROXICET Take 1 tablet by mouth every 4 (four) hours as needed for severe pain.  senna 8.6 MG tablet Commonly known as:  SENOKOT Take 1 tablet by mouth daily.  tobramycin-dexamethasone ophthalmic solution Commonly known as:  TOBRADEX Place 2 drops into the left eye 2 (two) times daily. For 7 days  triamcinolone 0.025 % cream Commonly known as:  KENALOG Apply 1 application topically 2 (two) times daily as needed (for itching feet). Apply to the left or right foot       Relevant Imaging Results:  Relevant Lab Results:   Additional Information SSN [id number] Patient will be returning to ALF Special Care Unit with Hospice.   Clearance CootsSinclair, Sabrie Moritz A, LCSW

## 2015-11-18 NOTE — Progress Notes (Signed)
[facility] with critical lab results as follows: Sodium 163 and Chloride greater than 130. Nurse paged on-call hospitalist, Dr. Hyman HopesJegede to make provider aware of critical labs. Dr. Hyman HopesJegede returned call to nurse. Per Dr. Hyman HopesJegede start patient on Dextrose in water, D5, at 4575ml/hr and address labs with rounding provider.

## 2015-11-18 NOTE — Progress Notes (Signed)
PROGRESS NOTE    Shelley Fowler  ZOX:[id number] DOB: 03-12-1925 DOA: 11/17/2015 PCP: [name], MD   Brief Narrative: 82 year old female with severe dementia with behavioral disturbance, presented from assisted living facility after a fall, sustaining hematoma on her right forehead, found to have abnormal electrolytes including serum sodium level of 166 and worsening renal failure. Palliative care was consulted on admission. Treating for UTI and IV fluids.  Assessment & Plan:   #Likely chronic hypernatremia with dehydration: On admission, patient was treated with normal saline and later on switched to dextrose IV. Serum sodium level 163 today. I increased the rate of D5 W to100 mL an hour and discussed with the nursing staff to increase oral intake of free water. I will check BMP twice a day. We'll not be too aggressive with blood test. -Avoid rapid correction of serum sodium level.  #Severe dementia with behavioral disturbance: Resume Namenda. Continue to provide supportive care. PT, OT and social worker referral for safe discharge planning. Palliative care consult appreciated. Discussed with Dr. Neale Burly today plan for family meeting tomorrow afternoon.  #Acute cystitis without hematuria: Continue ceftriaxone. Follow up culture results.  #Acute on chronic kidney disease likely in the setting of dehydration and UTI: Serum creatinine level already improving with IV fluid. Monitor BMP.  #Fall sustaining right temporal hematoma: Continue supportive care.  DVT prophylaxis: SCD and early embolism. Holding anticoagulation because of hematoma. Code Status: DO NOT RESUSCITATE Family Communication: No family present at bedside Disposition Plan: Likely discharge to nursing home in 1-2 days.    Consultants:   Palliative care  Procedures: None Antimicrobials: Ceftriaxone since November 11.  Subjective: Patient was seen and examined at bedside. Patient is alert awake but not oriented. She  was with intermittent agitation. Sitter at bedside. Denies any needs. Unable to obtain review of system because of her dementia.   Objective: Vitals:   11/17/15 2051 11/18/15 0523 11/18/15 1330 11/18/15 1337  BP: (!) 97/51 104/68 (!) 106/54   Pulse: 65 65 62   Resp: 16 16 17    Temp: 98.9 F (37.2 C) 98.6 F (37 C) 98.1 F (36.7 C)   TempSrc: Oral Oral Oral   SpO2: 97% 99% (!) 87% 92%  Weight:      Height:        Intake/Output Summary (Last 24 hours) at 11/18/15 1345 Last data filed at 11/18/15 1308  Gross per 24 hour  Intake              480 ml  Output                0 ml  Net              480 ml   Filed Weights   11/17/15 1702  Weight: 39.8 kg (87 lb 11.9 oz)    Examination:  General exam: Alert awake and with intermittent agitation  Respiratory system: Clear to auscultation. Respiratory effort normal. No wheezing or crackle Cardiovascular system: S1 & S2 heard, RRR.  No pedal edema. Gastrointestinal system: Abdomen is nondistended, soft and nontender. Normal bowel sounds heard. Central nervous system: Alert, awake but not oriented. Extremities: Unable to take because of her dementia.. Skin: No rashes, lesions or ulcers Psychiatry: Patient has severe dementia therefore unable to assess.     Data Reviewed: I have personally reviewed following labs and imaging studies  CBC:  Recent Labs Lab 11/17/15 0848  WBC 8.1  NEUTROABS 5.8  HGB 12.7  HCT 38.6  MCV  83.5  PLT 184   Basic Metabolic Panel:  Recent Labs Lab 11/17/15 0848 11/18/15 0405  NA 166* 163*  K 4.4 4.0  CL >130* >130*  CO2 22 23  GLUCOSE 88 100*  BUN 65* 50*  CREATININE 1.89* 1.51*  CALCIUM 9.2 8.7*   GFR: Estimated Creatinine Clearance: 16.2 mL/min (by C-G formula based on SCr of 1.51 mg/dL (H)). Liver Function Tests: No results for input(s): AST, ALT, ALKPHOS, BILITOT, PROT, ALBUMIN in the last 168 hours. No results for input(s): LIPASE, AMYLASE in the last 168 hours. No results  for input(s): AMMONIA in the last 168 hours. Coagulation Profile: No results for input(s): INR, PROTIME in the last 168 hours. Cardiac Enzymes:  Recent Labs Lab 11/17/15 0848  CKTOTAL 304*   BNP (last 3 results) No results for input(s): PROBNP in the last 8760 hours. HbA1C: No results for input(s): HGBA1C in the last 72 hours. CBG: No results for input(s): GLUCAP in the last 168 hours. Lipid Profile: No results for input(s): CHOL, HDL, LDLCALC, TRIG, CHOLHDL, LDLDIRECT in the last 72 hours. Thyroid Function Tests: No results for input(s): TSH, T4TOTAL, FREET4, T3FREE, THYROIDAB in the last 72 hours. Anemia Panel: No results for input(s): VITAMINB12, FOLATE, FERRITIN, TIBC, IRON, RETICCTPCT in the last 72 hours. Sepsis Labs: No results for input(s): PROCALCITON, LATICACIDVEN in the last 168 hours.  Recent Results (from the past 240 hour(s))  Urine culture     Status: None (Preliminary result)   Collection Time: 11/17/15 11:35 AM  Result Value Ref Range Status   Specimen Description URINE, RANDOM  Final   Special Requests NONE  Final   Culture   Final    CULTURE REINCUBATED FOR BETTER GROWTH [facility]formed at [facility]    Report Status PENDING  Incomplete  MRSA PCR Screening     Status: Abnormal   Collection Time: 11/17/15  5:32 PM  Result Value Ref Range Status   MRSA by PCR POSITIVE (A) NEGATIVE Final    Comment:        The GeneXpert MRSA Assay (FDA approved for NASAL specimens only), is one component of a comprehensive MRSA colonization surveillance program. It is not intended to diagnose MRSA infection nor to guide or monitor treatment for MRSA infections. RESULT CALLED TO, READ BACK BY AND VERIFIED WITH: H WOODS RN @ 2245 ON 11/17/15 BY C DAVIS          Radiology Studies: Ct Head Wo Contrast  Result Date: 11/17/2015 CLINICAL DATA:  Dementia.  Fall with trauma to the head and neck. EXAM: CT HEAD WITHOUT CONTRAST CT CERVICAL SPINE WITHOUT CONTRAST  TECHNIQUE: Multidetector CT imaging of the head and cervical spine was performed following the standard protocol without intravenous contrast. Multiplanar CT image reconstructions of the cervical spine were also generated. COMPARISON:  04/01/2015 FINDINGS: CT HEAD FINDINGS Brain: Generalized atrophy not temporal lobe predominant. No sign of acute infarction, mass lesion, hemorrhage hydrocephalus or extra-axial collection. Chronic small-vessel ischemic changes affecting the white matter and basal ganglia regions. Vascular: There is atherosclerotic calcification of the major vessels at the base of the brain. Skull: No skull fracture. Sinuses/Orbits: Clear/normal Other: Right frontal scalp hematoma. CT CERVICAL SPINE FINDINGS Alignment: Straightening of the normal cervical lordosis. 3 mm anterolisthesis C4-5. 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 Soft tissues and spinal canal: Negative Disc levels:  Ordinary osteoarthritis C1-2. Facet arthropathy right more than left at C2-3. Bilateral facet arthropathy C3-4. Bilateral facet arthropathy C4-5 worse on the left.  Foraminal stenosis on the left because of osteophytic encroachment. 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  No stenosis. Degenerative spondylosis at C6-7 with mild foraminal narrowing bilaterally. Chronic degenerative spondylosis at C7-T1 without bony stenosis. Upper chest: Emphysema and scarring. Other: None significant IMPRESSION: Head CT: Right frontal scalp hematoma. No skull fracture. No intracranial injury. Atrophy and chronic small vessel disease. Cervical spine CT: No acute or traumatic finding. 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 Chronic degenerative changes above and below that. Electronically Signed   By: Paulina FusiMark  Shogry M.D.   On: 11/17/2015 08:57   Ct Cervical Spine Wo Contrast  Result Date: 11/17/2015 CLINICAL DATA:  Dementia.  Fall with trauma to the head and neck. EXAM: CT HEAD WITHOUT CONTRAST CT CERVICAL SPINE WITHOUT CONTRAST TECHNIQUE:  Multidetector CT imaging of the head and cervical spine was performed following the standard protocol without intravenous contrast. Multiplanar CT image reconstructions of the cervical spine were also generated. COMPARISON:  04/01/2015 FINDINGS: CT HEAD FINDINGS Brain: Generalized atrophy not temporal lobe predominant. No sign of acute infarction, mass lesion, hemorrhage hydrocephalus or extra-axial collection. Chronic small-vessel ischemic changes affecting the white matter and basal ganglia regions. Vascular: There is atherosclerotic calcification of the major vessels at the base of the brain. Skull: No skull fracture. Sinuses/Orbits: Clear/normal Other: Right frontal scalp hematoma. CT CERVICAL SPINE FINDINGS Alignment: Straightening of the normal cervical lordosis. 3 mm anterolisthesis C4-5. 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 Soft tissues and spinal canal: Negative Disc levels:  Ordinary osteoarthritis C1-2. Facet arthropathy right more than left at C2-3. Bilateral facet arthropathy C3-4. Bilateral facet arthropathy C4-5 worse on the left. Foraminal stenosis on the left because of osteophytic encroachment. 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  No stenosis. Degenerative spondylosis at C6-7 with mild foraminal narrowing bilaterally. Chronic degenerative spondylosis at C7-T1 without bony stenosis. Upper chest: Emphysema and scarring. Other: None significant IMPRESSION: Head CT: Right frontal scalp hematoma. No skull fracture. No intracranial injury. Atrophy and chronic small vessel disease. Cervical spine CT: No acute or traumatic finding. 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 Chronic degenerative changes above and below that. Electronically Signed   By: Paulina FusiMark  Shogry M.D.   On: 11/17/2015 08:57        Scheduled Meds: . cefTRIAXone (ROCEPHIN)  IV  1 g Intravenous Q24H  . Chlorhexidine Gluconate Cloth  6 each Topical Q0600  . LORazepam  0.25 mg Oral Q12H  . mouth rinse  15 mL Mouth Rinse BID  .  mupirocin ointment  1 application Nasal BID   Continuous Infusions: . dextrose 100 mL/hr at 11/18/15 0909     LOS: 1 day    Time spent: 25 minutes.    Donneisha Beane Jaynie CollinsPrasad Magenta Schmiesing, MD [facility] Pager [phone]  If 7PM-7AM, please contact night-coverage [email or web address] Password [email or web address] 11/18/2015, 1:45 PM

## 2015-11-18 NOTE — Progress Notes (Signed)
Palliative Medicine Team consult was received.   We have scheduled a meeting with Ms. Tisdel's daughter tomorrow at [facility]4PM. If there are urgent needs or questions please call [phone]. Thank you for consulting out team to assist with this patients care.  Romie MinusGene Melba Araki, MD [facility] Palliative Medicine Team [phone]

## 2015-11-19 LAB — BASIC METABOLIC PANEL
ANION GAP: 6 (ref 5–15)
ANION GAP: 4 — AB (ref 5–15)
BUN: 33 mg/dL — AB (ref 6–20)
BUN: 26 mg/dL — AB (ref 6–20)
CALCIUM: 8.5 mg/dL — AB (ref 8.9–10.3)
CO2: 24 mmol/L (ref 22–32)
CO2: 24 mmol/L (ref 22–32)
Calcium: 8 mg/dL — ABNORMAL LOW (ref 8.9–10.3)
Chloride: 124 mmol/L — ABNORMAL HIGH (ref 101–111)
Chloride: 115 mmol/L — ABNORMAL HIGH (ref 101–111)
Creatinine, Ser: 1.17 mg/dL — ABNORMAL HIGH (ref 0.44–1.00)
Creatinine, Ser: 1.23 mg/dL — ABNORMAL HIGH (ref 0.44–1.00)
GFR calc Af Amer: 47 mL/min — ABNORMAL LOW (ref >60)
GFR calc Af Amer: 44 mL/min — ABNORMAL LOW (ref >60)
GFR calc non Af Amer: 40 mL/min — ABNORMAL LOW (ref >60)
GFR calc non Af Amer: 38 mL/min — ABNORMAL LOW (ref >60)
GLUCOSE: 115 mg/dL — AB (ref 65–99)
GLUCOSE: 149 mg/dL — AB (ref 65–99)
POTASSIUM: 3 mmol/L — AB (ref 3.5–5.1)
Potassium: 3.5 mmol/L (ref 3.5–5.1)
Sodium: 154 mmol/L — ABNORMAL HIGH (ref 135–145)
Sodium: 143 mmol/L (ref 135–145)

## 2015-11-19 LAB — URINE CULTURE: Culture: >=100000 — AB

## 2015-11-19 MED ORDER — AMPICILLIN 500 MG PO CAPS
500.0000 mg | ORAL_CAPSULE | Freq: Two times a day (BID) | ORAL | Status: DC
  Administered 2015-11-19 – 2015-11-20 (×3): 500 mg via ORAL
  Filled 2015-11-19 (×5): qty 1

## 2015-11-19 MED ORDER — NITROFURANTOIN MONOHYD MACRO 100 MG PO CAPS: 100.0000 mg | ORAL_CAPSULE | Freq: Two times a day (BID) | ORAL | Status: DC

## 2015-11-19 MED ORDER — POTASSIUM CHLORIDE CRYS ER 20 MEQ PO TBCR
40.0000 meq | EXTENDED_RELEASE_TABLET | Freq: Once | ORAL | Status: AC
  Administered 2015-11-19: 40 meq via ORAL
  Filled 2015-11-19: qty 2

## 2015-11-19 NOTE — Clinical Social Work Note (Signed)
Clinical Social Work Assessment  Patient Details  Name: Shelley Fowler MRN: [id number] Date of Birth: Oct 06, 1925  Date of referral:  11/19/15               Reason for consult:  Facility Placement                Permission sought to share information with:  Family Supports, Magazine features editoracility Contact Representative Permission granted to share information::     Name::        Agency::  SNF  Relationship::  Daughter  Contact Information:  Gwynneth AlbrightBrenda Funderburk [phone]  Housing/Transportation Living arrangements for the past 2 months:  Assisted Living Facility [facility] ) Source of Information:  Adult Children Patient Interpreter Needed:  None Criminal Activity/Legal Involvement Pertinent to Current Situation/Hospitalization:  No - Comment as needed Significant Relationships:  Adult Children Lives with:  Facility Resident Do you feel safe going back to the place where you live?  No Need for family participation in patient care:  Yes (Comment) (Patient has severe dementia )  Care giving concerns:  PT is recommending Skilled Care at this time. The pt. Daughter is agreeable to recommendations.  The pt. Daughter reports pt. Has [facility] the [facility] two years. She reports, " everything happen so fast, and I am not familiar with the process. I [facility]."    Social Worker assessment / plan: LCSWA followed up with pt. Daughter in regards to d/c planning. She expressed the need for understanding of SNF process. LCSWA educated her about the SNF process.  LCSWA will provide bed offers to daughter this afternoon when she arrives to meet with Palliative care.  LCSWA faxed patient out.  Employment status:    Insurance information:  Medicare PT Recommendations:  Skilled Nursing Facility Information / Referral to community resources:  Skilled Nursing Facility  Patient/Family's Response to care: Patient family agreeable to care and working  with medical staff to assist in making appropriate decisions for patient.   Patient/Family's Understanding of and Emotional Response to Diagnosis, Current Treatment, and Prognosis: Patient daughter wants to know and understand and learn about patient diagnosis and current treatment.   Emotional Assessment Appearance:  Appears stated age Attitude/Demeanor/Rapport:  Lethargic Affect (typically observed):  Quiet, Calm Orientation:  Oriented to Self Alcohol / Substance use:  Not Applicable Psych involvement (Current and /or in the community):  No  Discharge Needs  Concerns to be addressed:  Discharge Planning Concerns, Care Coordination Readmission within the last 30 days:    Current discharge risk:  Dependent with Mobility Barriers to Discharge:  Continued Medical Work up   [employer] A [name], LCSW 11/19/2015, 11:04 AM

## 2015-11-19 NOTE — Progress Notes (Addendum)
PROGRESS NOTE    Shelley Fowler  EAV:[id number] DOB: 1925/03/09 DOA: 11/17/2015 PCP: Florentina JennyRIPP, HENRY, MD   Brief Narrative: 82 year old female with severe dementia with behavioral disturbance, presented from assisted living facility after a fall, sustaining hematoma on her right forehead, found to have abnormal electrolytes including serum sodium level of 166 and worsening renal failure. Palliative care was consulted on admission. Treating for UTI and IV fluids.  Assessment & Plan:   #Likely chronic hypernatremia with dehydration: Serum sodium level improving to 154 detected today morning. Continue D5W at 100 mL, tolerated reduce this morning. Continue to monitor BMP. Encourage oral intake.  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 Continue to provide supportive care. PT and OT recommended nursing home. Social worker evaluation ongoing for discharge. Discussed with the palliative care team, planned to meet with patient's starter today after none. The consult appreciated.   #Acute cystitis without hematuria: The urine culture growing enterococcus sensitive to ampicillin. Antibiotics switched  #Acute on chronic kidney disease stage 3 likely in the setting of dehydration and UTI: Serum creatinine level improved with IV fluid. #Fall sustaining right temporal hematoma: Continue supportive care.  DVT prophylaxis: SCD and early embolism. Holding anticoagulation because of hematoma. Code Status: DO NOT RESUSCITATE Family Communication: No family present at bedside Disposition Plan: Likely discharge to nursing home in 1-2 days.    Consultants:   Palliative care  Procedures: None Antimicrobials: Ceftriaxone since November 11, [facility] on November [facility].  Subjective: Patient was seen and examined at bedside. Patient was alert awake and not oriented. Unable to obtain any review of system because of dementia. Denies  pain. Objective: Vitals:   11/18/15 1337 11/18/15 1546 11/18/15 2145 11/19/15 0519  BP:  (!) 94/48 93/61 (!) 129/59  Pulse:  66 71 60  Resp:  18 18 18   Temp:  97.5 F (36.4 C) 97.9 F (36.6 C) 97.6 F (36.4 C)  TempSrc:  Axillary Oral Axillary  SpO2: 92% 100% 100% 99%  Weight:      Height:        Intake/Output Summary (Last 24 hours) at 11/19/15 1238 Last data filed at 11/19/15 0900  Gross per 24 hour  Intake          3043.75 ml  Output                0 ml  Net          3043.75 ml   Filed Weights   11/17/15 1702  Weight: 39.8 kg (87 lb 11.9 oz)    Examination:  General exam: Not in distress, lying on bed comfortable. Respiratory system: Clear bilateral, no wheezing or crackle. Cardiovascular system: Regular rate rhythm, S1 and S2 normal. No pedal edema. Gastrointestinal system: Abdomen is nondistended, soft and nontender. Normal bowel sounds heard. Central nervous system: Alert, awake but not oriented. Extremities: Unable to take because of her dementia.. Skin: No rashes, lesions or ulcers Psychiatry: Patient has severe dementia therefore unable to assess.     Data Reviewed: I have personally reviewed following labs and imaging studies  CBC:  Recent Labs Lab 11/17/15 0848  WBC 8.1  NEUTROABS 5.8  HGB 12.7  HCT 38.6  MCV 83.5  PLT 184   Basic Metabolic Panel:  Recent Labs Lab 11/17/15 0848 11/18/15 0405 11/18/15 1646 11/19/15 0508  NA 166* 163* 158* 154*  K 4.4 4.0 3.5 3.5  CL >130* >130* >130* 124*  CO2 22 23 25  24  GLUCOSE 88 100* 123* 115*  BUN 65* 50* 41* 33*  CREATININE 1.89* 1.51* 1.56* 1.17*  CALCIUM 9.2 8.7* 8.4* 8.5*   GFR: Estimated Creatinine Clearance: 20.9 mL/min (by C-G formula based on SCr of 1.17 mg/dL (H)). Liver Function Tests: No results for input(s): AST, ALT, ALKPHOS, BILITOT, PROT, ALBUMIN in the last 168 hours. No results for input(s): LIPASE, AMYLASE in the last 168 hours. No results for input(s): AMMONIA in the last  168 hours. Coagulation Profile: No results for input(s): INR, PROTIME in the last 168 hours. Cardiac Enzymes:  Recent Labs Lab 11/17/15 0848  CKTOTAL 304*   BNP (last 3 results) No results for input(s): PROBNP in the last 8760 hours. HbA1C: No results for input(s): HGBA1C in the last 72 hours. CBG: No results for input(s): GLUCAP in the last 168 hours. Lipid Profile: No results for input(s): CHOL, HDL, LDLCALC, TRIG, CHOLHDL, LDLDIRECT in the last 72 hours. Thyroid Function Tests: No results for input(s): TSH, T4TOTAL, FREET4, T3FREE, THYROIDAB in the last 72 hours. Anemia Panel: No results for input(s): VITAMINB12, FOLATE, FERRITIN, TIBC, IRON, RETICCTPCT in the last 72 hours. Sepsis Labs: No results for input(s): PROCALCITON, LATICACIDVEN in the last 168 hours.  Recent Results (from the past 240 hour(s))  Urine culture     Status: Abnormal   Collection Time: 11/17/15 11:35 AM  Result Value Ref Range Status   Specimen Description URINE, RANDOM  Final   Special Requests NONE  Final   Culture >=100,000 COLONIES/mL ENTEROCOCCUS FAECIUM (A)  Final   Report Status 11/19/2015 FINAL  Final   Organism ID, Bacteria ENTEROCOCCUS FAECIUM (A)  Final      Susceptibility   Enterococcus faecium - MIC*    AMPICILLIN <=2 SENSITIVE Sensitive     LEVOFLOXACIN 4 INTERMEDIATE Intermediate     NITROFURANTOIN <=16 SENSITIVE Sensitive     VANCOMYCIN <=0.5 SENSITIVE Sensitive     * >=100,000 COLONIES/mL ENTEROCOCCUS FAECIUM  MRSA PCR Screening     Status: Abnormal   Collection Time: 11/17/15  5:32 PM  Result Value Ref Range Status   MRSA by PCR POSITIVE (A) NEGATIVE Final    Comment:        The GeneXpert MRSA Assay (FDA approved for NASAL specimens only), is one component of a comprehensive MRSA colonization surveillance program. It is not intended to diagnose MRSA infection nor to guide or monitor treatment for MRSA infections. RESULT CALLED TO, READ BACK BY AND VERIFIED WITH: H  WOODS RN @ 2245 ON 11/17/15 BY C DAVIS          Radiology Studies: No results found.      Scheduled Meds: . cefTRIAXone (ROCEPHIN)  IV  1 g Intravenous Q24H  . Chlorhexidine Gluconate Cloth  6 each Topical Q0600  . LORazepam  0.25 mg Oral Q12H  . mouth rinse  15 mL Mouth Rinse BID  . memantine  5 mg Oral Daily  . mupirocin ointment  1 application Nasal BID   Continuous Infusions: . dextrose 100 mL/hr at 11/19/15 0959     LOS: 2 days    Time spent: 26 minutes.    Dron Jaynie CollinsPrasad Bhandari, MD [facility] Pager [phone]  If 7PM-7AM, please contact night-coverage [email or web address] Password [email or web address] 11/19/2015, 12:38 PM

## 2015-11-20 ENCOUNTER — Encounter (HOSPITAL_COMMUNITY): Payer: Self-pay

## 2015-11-20 LAB — BASIC METABOLIC PANEL
ANION GAP: 3 — AB (ref 5–15)
BUN: 21 mg/dL — ABNORMAL HIGH (ref 6–20)
CHLORIDE: 119 mmol/L — AB (ref 101–111)
CO2: 25 mmol/L (ref 22–32)
CREATININE: 1.06 mg/dL — AB (ref 0.44–1.00)
Calcium: 8.2 mg/dL — ABNORMAL LOW (ref 8.9–10.3)
GFR calc non Af Amer: 45 mL/min — ABNORMAL LOW (ref >60)
GFR, EST AFRICAN AMERICAN: 53 mL/min — AB (ref >60)
Glucose, Bld: 88 mg/dL (ref 65–99)
POTASSIUM: 4 mmol/L (ref 3.5–5.1)
SODIUM: 147 mmol/L — AB (ref 135–145)

## 2015-11-20 MED ORDER — HYDROCERIN EX CREA
TOPICAL_CREAM | Freq: Two times a day (BID) | CUTANEOUS | Status: DC
  Administered 2015-11-20: 10:00:00 via TOPICAL
  Filled 2015-11-20: qty 113

## 2015-11-20 MED ORDER — AMPICILLIN 500 MG PO CAPS: 500.0000 mg | ORAL_CAPSULE | Freq: Two times a day (BID) | ORAL | 0 refills | Status: AC

## 2015-11-20 NOTE — Consult Note (Addendum)
WOC Nurse wound consult note Reason for Consult:Stasis dermatitis of the RLE with two denuded areas. Prophylactic dressing in place at sacrum. Wound type:venous insufficiency Pressure Ulcer POA:No Measurement: 2.0 x 1.0 x 0.1cm (proximal) and 3cm x 0.4cm x 0.1cm (distal) Wound ZOX:WRUEbed:pink, moist Drainage (amount, consistency, odor) none Periwound:dry, flaking tissue in the gaitor area (pretibial) and posterior LE Dressing procedure/placement/frequency: I will provide bedside RN with guidance for care of tissue using twice daily applications of Eucerin cream applied in am after bathing and at hs. Will provide heel boots. WOC nursing team will not follow, but will remain available to this patient, the nursing and medical teams.  Please re-consult if needed. Thanks, Ladona MowLaurie Dream Nodal, MSN, RN, GNP, Hans EdenCWOCN, CWON-AP, FAAN  Pager# [phone]

## 2015-11-20 NOTE — Clinical Social Work Placement (Signed)
   CLINICAL SOCIAL WORK PLACEMENT  NOTE  Date:  11/20/2015  Patient Details  Name: Shelley Fowler MRN: [id number] Date of Birth: 26-Aug-1925  Clinical Social Work is seeking post-discharge placement for this patient at the Assisted Living Facility level of care (*CSW will initial, date and re-position this form in  chart as items are completed):  Yes   Patient/family provided with [facility] Clinical Social Work Department's list of facilities offering this level of care within the geographic area requested by the patient (or if unable, by the patient's family).  Yes   Patient/family informed of their freedom to choose among providers that offer the needed level of care, that participate in Medicare, Medicaid or managed care program needed by the patient, have an available bed and are willing to accept the patient.  Yes   Patient/family informed of 's ownership interest in [facility] and [facility], as well as of the fact that they are under no obligation to receive care at these facilities.  PASRR submitted to EDS on       PASRR number received on       Existing PASRR number confirmed on 11/20/15     FL2 transmitted to all facilities in geographic area requested by pt/family on       FL2 transmitted to all facilities within larger geographic area on       Patient informed that his/her managed care company has contracts with or will negotiate with certain facilities, including the following:        Yes   Patient/family informed of bed offers received.  Patient chooses bed at    [facility]   Physician recommends and patient chooses bed at   [facility] Patient to be transferred to   on 11/20/15.  Patient to be transferred to facility by [facility]AR     Patient family notified on 11/20/15 of transfer.  Name of family member notified:  Daughter      PHYSICIAN       Additional Comment:  Patient will be [facility] with Hospice Services  _______________________________________________ Clearance CootsNicole A Neelam Tiggs, LCSW 11/20/2015, 11:04 AM

## 2015-11-20 NOTE — Progress Notes (Signed)
Physical Therapy Treatment Patient Details Name: Shelley Leedsnn J Shelley Fowler MRN: [id number] DOB: 1925-05-09 Today's Date: 11/20/2015    History of Present Illness Pt admitted through the ED s/p fall with R temporal hematoma and hx of dementia    PT Comments    Pt present with advanced dementia unable to follow any commands.  Found in bed incont urine.  [facility]  Amb + 2 hand held assist with 3rd following with recliner.  Very unsteady gait.    Follow Up Recommendations  SNF     Equipment Recommendations       Recommendations for Other Services       Precautions / Restrictions Precautions Precautions: Fall Precaution Comments: advanced dementia Restrictions Weight Bearing Restrictions: No    Mobility  Bed Mobility Overal bed mobility: Needs Assistance Bed Mobility: Supine to Sit     Supine to sit: Mod assist;Max assist     General bed mobility comments: repeat one word VC's and increased time to complete   Transfers Overall transfer level: Needs assistance Equipment used: None Transfers: Stand Pivot Transfers Sit to Stand: Mod assist;Max assist;+2 physical assistance;+2 safety/equipment Stand pivot transfers: Mod assist;Max assist;+2 physical assistance;+2 safety/equipment       General transfer comment: [facility] assist with little effort from pt, B hips and knees flex.    Ambulation/Gait   Ambulation Distance (Feet): 28 Feet Assistive device: 2 person hand held assist   Gait velocity: decreased   General Gait Details: + 2 hand held assist, very unsteady scissoring gait.  Unable to follow commands of upright posture.  Recliner following closely behind.     Stairs            Wheelchair Mobility    Modified Rankin (Stroke Patients Only)       Balance                                    Cognition Arousal/Alertness: Awake/alert Behavior During Therapy: Impulsive;Restless Overall Cognitive Status: History of  cognitive impairments - at baseline                      Exercises      General Comments        Pertinent Vitals/Pain Pain Assessment: No/denies pain    Home Living                      Prior Function            PT Goals (current goals can now be found in the care plan section) Progress towards PT goals: Progressing toward goals    Frequency    Min 2X/week      PT Plan Current plan remains appropriate    Co-evaluation             End of Session Equipment Utilized During Treatment: Gait belt Activity Tolerance: Patient tolerated treatment well Patient left: in chair;with call bell/phone within reach;with chair alarm set     Time: 1131-1202 PT Time Calculation (min) (ACUTE ONLY): 31 min  Charges:  $Gait Training: 8-22 mins $Therapeutic Activity: 8-22 mins                    G Codes:      Shelley ShellingLori Trenyce Fowler  PTA [facility]  Acute  Rehab Pager      [phone]

## 2015-11-20 NOTE — Progress Notes (Signed)
Called report to ComptrollerJoyce RN at [facility].  Patient transported to facility via EMS.

## 2015-11-20 NOTE — Progress Notes (Signed)
[facility] - Social Work Liaison   [facility] received request from [employer] Rhonda for [facility] [facility] after discharge. Chart and patient information currently under review to confirm hospice eligibility.   Spoke with daughter [name] by phone to confirm interest and initiate education related to hospice philosophy, services and team approach to care. [name] is familiar with [facility] serivces as patient has been active with [facility] before. Understand from [facility] and family that plan is for patient to return to ALF today. Daughter is agreeable for patient to be [facility]. Daughter identified no new DME needs.    [facility] Referral Center aware of the above.   Please send signed completed out of facility DNR home with patient.  Please send prescription for any medications patient does not already have including comfort medications.  [facility] at [phone].    Above information shared with [facility].  Please call with any questions.  Thank you,  Forrestine Himva Davis, LCSW [phone]

## 2015-11-20 NOTE — Discharge Summary (Signed)
Physician Discharge Summary  Shelley Fowler:[id number] DOB: [birth year] DOA: 11/17/2015  PCP: Shelley Jenny, MD  Admit date: 11/17/2015 Discharge date: 11/20/2015  Admitted From:ALF Disposition: ALF with hospice service  Recommendations for Outpatient Follow-up:  1. Follow up with PCP in 1-2 weeks 2. Please obtain BMP/CBC in one week 3. Encourage to drink water and food intake.   Home Health:No Equipment/Devices: No Discharge Condition: Stable, going to nursing home with hospice CODE STATUS: DO NOT RESUSCITATE Diet recommendation: Regular  Brief/Interim Summary:82 year old female with severe dementia with behavioral disturbance, presented from assisted living facility after a fall, sustaining hematoma on her right forehead, found to have abnormal electrolytes including serum sodium level of 166 and worsening renal failure. Palliative care was consulted on admission.  #Likely chronic hypernatremia with dehydration:  treated with IV fluid with improvement in serum sodium level. Patient needs assistance for feeding and encourage oral intake of water/fluid. Recommended to monitor BMP as an outpatient. -Patient needs enough hydration at SNF.  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 Continue to provide supportive care. Evaluated by palliative care service and social worker. Patient is being discharged to nursing home with hospice care service. Continue supportive care.  #Acute cystitis without hematuria: The urine culture growing enterococcus sensitive to ampicillin. Antibiotics switched to oral ampicillin.  #Acute on chronic kidney disease stage 3 likely in the setting of dehydration and UTI: Serum creatinine level improved with IV fluid.  #Fall sustaining right temporal hematoma: Continue supportive care.  Discharge Diagnoses:  Active Problems:   Hyponatremia   Acute hypernatremia   Chronic hypernatremia   Acute  renal failure superimposed on chronic kidney disease (HCC)   Acute cystitis without hematuria   Dementia with behavioral disturbance    Discharge Instructions  Discharge Instructions    Amb Referral to Palliative Care    Complete by:  As directed    Call MD for:  difficulty breathing, headache or visual disturbances    Complete by:  As directed    Call MD for:  hives    Complete by:  As directed    Call MD for:  severe uncontrolled pain    Complete by:  As directed    Call MD for:  temperature >100.4    Complete by:  As directed    Diet - low sodium heart healthy    Complete by:  As directed    Discharge instructions    Complete by:  As directed    Encourage oral intake and hydration.   Increase activity slowly    Complete by:  As directed        Medication List    TAKE these medications   acetaminophen 500 MG tablet Commonly known as:  TYLENOL Take 1,000 mg by mouth 3 (three) times daily.   ampicillin 500 MG capsule Commonly known as:  PRINCIPEN Take 1 capsule (500 mg total) by mouth every 12 (twelve) hours.   BAZA PROTECT EX Apply 1 application topically 2 (two) times daily. Apply to buttocks after each incontinence episode   CERAVE Crea Apply 1 application topically 3 (three) times daily. Apply to bilateral lower extremities   cholecalciferol 1000 units tablet Commonly known as:  VITAMIN D Take 1,000 Units by mouth daily with breakfast.   LORazepam 0.5 MG tablet Commonly known as:  ATIVAN Take 0.25 mg by mouth every 12 (twelve) hours.   LORazepam 0.5 MG tablet Commonly known as:  ATIVAN Take 0.5 mg by mouth  every 8 (eight) hours as needed for anxiety. agitation   memantine 5 MG tablet Commonly known as:  NAMENDA Take 5 mg by mouth daily.   NUTRITIONAL DRINK Liqd Take 1 Can by mouth 3 (three) times daily. *Mighty Shakes*   oxyCODONE-acetaminophen 5-325 MG tablet Commonly known as:  PERCOCET/ROXICET Take 1 tablet by mouth every 4 (four) hours as  needed for severe pain.   senna 8.6 MG tablet Commonly known as:  SENOKOT Take 1 tablet by mouth daily.   tobramycin-dexamethasone ophthalmic solution Commonly known as:  TOBRADEX Place 2 drops into the left eye 2 (two) times daily. For 7 days   triamcinolone 0.025 % cream Commonly known as:  KENALOG Apply 1 application topically 2 (two) times daily as needed (for itching feet). Apply to the left or right foot      Follow-up Information    Shelley JennyRIPP, HENRY, MD. Schedule an appointment as soon as possible for a visit in 1 week(s).   Specialty:  Family Medicine Contact information: [id number] [place] [address] [phone]          No Known Allergies  Consultations: Palliative care  Procedures/Studies: None   Subjective: Patient was seen and examined at bedside. Patient was more alert awake and eating breakfast this morning with the help of nursing staff. Denied pain, shortness of breath, nausea or vomiting. Review of system is limited because of underlying dementia.   Discharge Exam: Vitals:   11/20/15 0635 11/20/15 0900  BP: 118/74   Pulse:  65  Resp:    Temp:  97.5 F (36.4 C)   Vitals:   11/19/15 2130 11/20/15 0545 11/20/15 0635 11/20/15 0900  BP: 112/77  118/74   Pulse: 65 66  65  Resp: 16 16    Temp: 99.1 F (37.3 C) 98.7 F (37.1 C)  97.5 F (36.4 C)  TempSrc: Core (Comment) Oral  Oral  SpO2: 98% 98%  99%  Weight:      Height:        General: An early demented female looks comfortable. Pt is alert, awake, not in acute distress Cardiovascular: RRR, S1/S2 +, no rubs, no gallops Respiratory: CTA bilaterally, no wheezing, no rhonchi Abdominal: Soft, NT, ND, bowel sounds + Extremities: no edema, no cyanosis Neuro: Alert, awake but not oriented.   The results of significant diagnostics from this hospitalization (including imaging, microbiology, ancillary and laboratory) are listed below for reference.      Microbiology: Recent Results (from the past 240 hour(s))  Urine culture     Status: Abnormal   Collection Time: 11/17/15 11:35 AM  Result Value Ref Range Status   Specimen Description URINE, RANDOM  Final   Special Requests NONE  Final   Culture >=100,000 COLONIES/mL ENTEROCOCCUS FAECIUM (A)  Final   Report Status 11/19/2015 FINAL  Final   Organism ID, Bacteria ENTEROCOCCUS FAECIUM (A)  Final      Susceptibility   Enterococcus faecium - MIC*    AMPICILLIN <=2 SENSITIVE Sensitive     LEVOFLOXACIN 4 INTERMEDIATE Intermediate     NITROFURANTOIN <=16 SENSITIVE Sensitive     VANCOMYCIN <=0.5 SENSITIVE Sensitive     * >=100,000 COLONIES/mL ENTEROCOCCUS FAECIUM  MRSA PCR Screening     Status: Abnormal   Collection Time: 11/17/15  5:32 PM  Result Value Ref Range Status   MRSA by PCR POSITIVE (A) NEGATIVE Final    Comment:        The GeneXpert MRSA Assay (FDA approved for  NASAL specimens only), is one component of a comprehensive MRSA colonization surveillance program. It is not intended to diagnose MRSA infection nor to guide or monitor treatment for MRSA infections. RESULT CALLED TO, READ BACK BY AND VERIFIED WITH: H WOODS RN @ 2245 ON 11/17/15 BY C DAVIS      Labs: BNP (last 3 results) No results for input(s): BNP in the last 8760 hours. Basic Metabolic Panel:  Recent Labs Lab 11/18/15 0405 11/18/15 1646 11/19/15 0508 11/19/15 1745 11/20/15 0506  NA 163* 158* 154* 143 147*  K 4.0 3.5 3.5 3.0* 4.0  CL >130* >130* 124* 115* 119*  CO2 23 25 24 24 25   GLUCOSE 100* 123* 115* 149* 88  BUN 50* 41* 33* 26* 21*  CREATININE 1.51* 1.56* 1.17* 1.23* 1.06*  CALCIUM 8.7* 8.4* 8.5* 8.0* 8.2*   Liver Function Tests: No results for input(s): AST, ALT, ALKPHOS, BILITOT, PROT, ALBUMIN in the last 168 hours. No results for input(s): LIPASE, AMYLASE in the last 168 hours. No results for input(s): AMMONIA in the last 168 hours. CBC:  Recent Labs Lab 11/17/15 0848  WBC 8.1   NEUTROABS 5.8  HGB 12.7  HCT 38.6  MCV 83.5  PLT 184   Cardiac Enzymes:  Recent Labs Lab 11/17/15 0848  CKTOTAL 304*   BNP: Invalid input(s): POCBNP CBG: No results for input(s): GLUCAP in the last 168 hours. D-Dimer No results for input(s): DDIMER in the last 72 hours. Hgb A1c No results for input(s): HGBA1C in the last 72 hours. Lipid Profile No results for input(s): CHOL, HDL, LDLCALC, TRIG, CHOLHDL, LDLDIRECT in the last 72 hours. Thyroid function studies No results for input(s): TSH, T4TOTAL, T3FREE, THYROIDAB in the last 72 hours.  Invalid input(s): FREET3 Anemia work up No results for input(s): VITAMINB12, FOLATE, FERRITIN, TIBC, IRON, RETICCTPCT in the last 72 hours. Urinalysis    Component Value Date/Time   COLORURINE YELLOW 11/17/2015 1135   APPEARANCEUR TURBID (A) 11/17/2015 1135   LABSPEC 1.015 11/17/2015 1135   PHURINE 6.0 11/17/2015 1135   GLUCOSEU NEGATIVE 11/17/2015 1135   HGBUR MODERATE (A) 11/17/2015 1135   BILIRUBINUR NEGATIVE 11/17/2015 1135   KETONESUR NEGATIVE 11/17/2015 1135   PROTEINUR 30 (A) 11/17/2015 1135   NITRITE NEGATIVE 11/17/2015 1135   LEUKOCYTESUR LARGE (A) 11/17/2015 1135   Sepsis Labs Invalid input(s): PROCALCITONIN,  WBC,  LACTICIDVEN Microbiology Recent Results (from the past 240 hour(s))  Urine culture     Status: Abnormal   Collection Time: 11/17/15 11:35 AM  Result Value Ref Range Status   Specimen Description URINE, RANDOM  Final   Special Requests NONE  Final   Culture >=100,000 COLONIES/mL ENTEROCOCCUS FAECIUM (A)  Final   Report Status 11/19/2015 FINAL  Final   Organism ID, Bacteria ENTEROCOCCUS FAECIUM (A)  Final      Susceptibility   Enterococcus faecium - MIC*    AMPICILLIN <=2 SENSITIVE Sensitive     LEVOFLOXACIN 4 INTERMEDIATE Intermediate     NITROFURANTOIN <=16 SENSITIVE Sensitive     VANCOMYCIN <=0.5 SENSITIVE Sensitive     * >=100,000 COLONIES/mL ENTEROCOCCUS FAECIUM  MRSA PCR Screening     Status:  Abnormal   Collection Time: 11/17/15  5:32 PM  Result Value Ref Range Status   MRSA by PCR POSITIVE (A) NEGATIVE Final    Comment:        The GeneXpert MRSA Assay (FDA approved for NASAL specimens only), is one component of a comprehensive MRSA colonization surveillance program. It is not intended to  diagnose MRSA infection nor to guide or monitor treatment for MRSA infections. RESULT CALLED TO, READ BACK BY AND VERIFIED WITH: H WOODS RN @ 2245 ON 11/17/15 BY C DAVIS      Time coordinating discharge: 28 minutes  SIGNED:   Maxie Barb, MD  [facility] 11/20/2015, 11:24 AM  If 7PM-7AM, please contact night-coverage [email or web address] Password [email or web address]

## 2015-11-20 NOTE — Consult Note (Signed)
Consultation Note Date: 11/20/2015   Patient Name: Shelley Fowler  DOB: 1925-11-12  MRN: [id number]  Age / Sex: 82 y.o., female  PCP: Reymundo Poll, MD Referring Physician: Rosita Fire, MD  Reason for Consultation: Establishing goals of care  HPI/Patient Profile: 82 y.o. female  with past medical history of dementia living in memory care unit admitted on 11/17/2015 with urinary tract infection.   Clinical Assessment and Goals of Care: Met today with patient daughter.  She reports that her mother has been in memory care unit for 3 years.  Was on hospice earlier this year and very happy with services.  Her mother was discharge due to stability.  Over the last 2 months she has had 4 ED visits and was admitted this admission with hypernatermia and AKI.   A  discussion was had today regarding advanced directives.  Concepts specific to code status, artifical feeding and hydration, continued IV antibiotics and rehospitalization was had.  The difference between a aggressive medical intervention path  and a palliative comfort care path for this patient at this time was had.  Values and goals of care important to patient and family were attempted to be elicited.  Concept of Hospice was discussed  Natural trajectory and expectations at EOL were discussed.  Questions and concerns addressed.  Family encouraged to call with questions or concerns.  PMT will continue to support holistically.   SUMMARY OF RECOMMENDATIONS   - Continue current therapy to medically maximize patient - Daughter is hopeful to get back to prior ALF memory unit with hospice support. - SW aware and working to see if can get back to ALF.  Code Status/Advance Care Planning:  DNR  Palliative Prophylaxis:   Aspiration, Bowel Regimen, Delirium Protocol and Frequent Pain Assessment  Additional Recommendations (Limitations, Scope,  Preferences):  Avoid Hospitalization  Psycho-social/Spiritual:   Desire for further Chaplaincy support:no  Additional Recommendations: Education on Hospice  Prognosis:   < 6 months  Discharge Planning: Memory unit with hospice      Primary Diagnoses: Present on Admission: . Hyponatremia . Acute hypernatremia   I have reviewed the medical record, interviewed the patient and family, and examined the patient. The following aspects are pertinent.  Past Medical History:  Diagnosis Date  . Dementia    Social History   Social History  . Marital status: Legally Separated    Spouse name: N/A  . Number of children: N/A  . Years of education: N/A   Social History Main Topics  . Smoking status: Never Smoker  . Smokeless tobacco: Never Used  . Alcohol use No  . Drug use: No  . Sexual activity: Not Asked   Other Topics Concern  . None   Social History Narrative  . None   Family History  Problem Relation Age of Onset  . Hypertension Other    Scheduled Meds: . ampicillin  500 mg Oral Q12H  . Chlorhexidine Gluconate Cloth  6 each Topical Q0600  . LORazepam  0.25 mg Oral Q12H  .  mouth rinse  15 mL Mouth Rinse BID  . memantine  5 mg Oral Daily  . mupirocin ointment  1 application Nasal BID   Continuous Infusions: PRN Meds:.LORazepam Medications Prior to Admission:  Prior to Admission medications   Medication Sig Start Date End Date Taking? Authorizing Provider  acetaminophen (TYLENOL) 500 MG tablet Take 1,000 mg by mouth 3 (three) times daily.   Yes Historical Provider, MD  cholecalciferol (VITAMIN D) 1000 units tablet Take 1,000 Units by mouth daily with breakfast.   Yes Historical Provider, MD  Emollient (CERAVE) CREA Apply 1 application topically 3 (three) times daily. Apply to bilateral lower extremities   Yes Historical Provider, MD  LORazepam (ATIVAN) 0.5 MG tablet Take 0.25 mg by mouth every 12 (twelve) hours.   Yes Historical Provider, MD  LORazepam  (ATIVAN) 0.5 MG tablet Take 0.5 mg by mouth every 8 (eight) hours as needed for anxiety. agitation   Yes Historical Provider, MD  memantine (NAMENDA) 5 MG tablet Take 5 mg by mouth daily.    Yes Historical Provider, MD  Nutritional Supplements (NUTRITIONAL DRINK) LIQD Take 1 Can by mouth 3 (three) times daily. *Mighty Shakes*   Yes Historical Provider, MD  oxyCODONE-acetaminophen (PERCOCET/ROXICET) 5-325 MG tablet Take 1 tablet by mouth every 4 (four) hours as needed for severe pain.   Yes Historical Provider, MD  senna (SENOKOT) 8.6 MG tablet Take 1 tablet by mouth daily.   Yes Historical Provider, MD  Skin Protectants, Misc. (BAZA PROTECT EX) Apply 1 application topically 2 (two) times daily. Apply to buttocks after each incontinence episode   Yes Historical Provider, MD  tobramycin-dexamethasone [facility]) ophthalmic solution Place 2 drops into the left eye 2 (two) times daily. For 7 days 11/08/15  Yes Historical Provider, MD  triamcinolone (KENALOG) 0.025 % cream Apply 1 application topically 2 (two) times daily as needed (for itching feet). Apply to the left or right foot   Yes Historical Provider, MD   No Known Allergies Review of Systems Unable to obtain  Physical Exam General: Frail, no acute distress. Does not respond to verbal stimulation HEENT: No bruits, no goiter, no JVD Heart: Regular rate and rhythm. No murmur appreciated. Lungs: Fair air movement, clear Abdomen: Soft, nontender, nondistended, positive bowel sounds.  Ext: No significant edema Skin: Warm and dry Neuro: Grossly intact, nonfocal.   Vital Signs: BP 112/77 (BP Location: Right Arm)   Pulse 65   Temp 99.1 F (37.3 C) (Core (Comment))   Resp 16   Ht '5\' 1"'$  (1.549 m)   Wt 39.8 kg (87 lb 11.9 oz)   SpO2 98%   BMI 16.58 kg/m  Pain Assessment: No/denies pain       SpO2: SpO2: 98 % O2 Device:SpO2: 98 % O2 Flow Rate: .O2 Flow Rate (L/min): 2 L/min  IO: Intake/output summary:  Intake/Output Summary (Last  24 hours) at 11/20/15 0152 Last data filed at 11/19/15 2215  Gross per 24 hour  Intake          4417.08 ml  Output                0 ml  Net          4417.08 ml    LBM: Last BM Date: 11/19/15 Baseline Weight: Weight: 39.8 kg (87 lb 11.9 oz) Most recent weight: Weight: 39.8 kg (87 lb 11.9 oz)     Palliative Assessment/Data:     Time In: 1400  Time Out: 1450 Time Total: 50 Greater than 50%  of this time was spent counseling and coordinating care related to the above assessment and plan.  Signed by: Micheline Rough, MD   Please contact Palliative Medicine Team phone at [phone] for questions and concerns.  For individual provider: See Shea Evans

## 2015-11-20 NOTE — Progress Notes (Signed)
Date:  November 20, 2015 Tct-Wendy Slingerland with [facility] informed of [facility] and need for hospice care. Marcelle Smilinghonda Davis, BSN, AlleeneRN3, [place]   [phone]

## 2015-12-12 ENCOUNTER — Emergency Department (HOSPITAL_COMMUNITY)
Admission: EM | Admit: 2015-12-12 | Discharge: 2015-12-12 | Disposition: A | Discharge status: Home / Self Care | Attending: Emergency Medicine | Admitting: Emergency Medicine

## 2015-12-12 ENCOUNTER — Encounter (HOSPITAL_COMMUNITY): Payer: Self-pay

## 2015-12-12 ENCOUNTER — Emergency Department (HOSPITAL_COMMUNITY)

## 2015-12-12 DIAGNOSIS — S199XXA Unspecified injury of neck, initial encounter: Secondary | ICD-10-CM | POA: Diagnosis present

## 2015-12-12 DIAGNOSIS — W19XXXA Unspecified fall, initial encounter: Secondary | ICD-10-CM | POA: Insufficient documentation

## 2015-12-12 DIAGNOSIS — Y999 Unspecified external cause status: Secondary | ICD-10-CM | POA: Insufficient documentation

## 2015-12-12 DIAGNOSIS — G309 Alzheimer's disease, unspecified: Secondary | ICD-10-CM | POA: Diagnosis not present

## 2015-12-12 DIAGNOSIS — S12690A Other displaced fracture of seventh cervical vertebra, initial encounter for closed fracture: Secondary | ICD-10-CM

## 2015-12-12 DIAGNOSIS — N183 Chronic kidney disease, stage 3 (moderate): Secondary | ICD-10-CM | POA: Diagnosis not present

## 2015-12-12 DIAGNOSIS — S12600A Unspecified displaced fracture of seventh cervical vertebra, initial encounter for closed fracture: Secondary | ICD-10-CM | POA: Insufficient documentation

## 2015-12-12 DIAGNOSIS — Y929 Unspecified place or not applicable: Secondary | ICD-10-CM | POA: Insufficient documentation

## 2015-12-12 DIAGNOSIS — Z79899 Other long term (current) drug therapy: Secondary | ICD-10-CM | POA: Diagnosis not present

## 2015-12-12 DIAGNOSIS — Y939 Activity, unspecified: Secondary | ICD-10-CM | POA: Diagnosis not present

## 2015-12-12 NOTE — ED Notes (Signed)
[facility] contacted for transport back [facility].  Family at bedside made aware of dispo

## 2015-12-12 NOTE — ED Provider Notes (Signed)
[facility]-EMERGENCY DEPT Provider Note   CSN: [id number] Arrival date & time: 12/12/15  [id number]     History   Chief Complaint Chief Complaint  Patient presents with  . Fall    HPI Shelley Fowler is a 82 y.o. female with history of dementia and 3 falls within the last 4 months brought in by EMS from [facility] for unwitnessed fall this morning. Patient is verbal but does not respond appropriately to any questions.  The nursing home advised pt to be seen at ED per protocol for unwitnessed fall. No one else at bedside.   The history is provided by the nursing home and the EMS personnel. The history is limited by the condition of the patient.    Past Medical History:  Diagnosis Date  . CKD (chronic kidney disease)    Stage III  . Dementia   . Fall     Patient Active Problem List   Diagnosis Date Noted  . Chronic hypernatremia   . Acute renal failure superimposed on chronic kidney disease (HCC)   . Acute cystitis without hematuria   . Dementia with behavioral disturbance   . Hyponatremia 11/17/2015  . Acute hypernatremia 11/17/2015  . Palliative care encounter 04/03/2015  . DNR (do not resuscitate) 04/03/2015  . Pressure ulcer 04/02/2015  . Protein-calorie malnutrition, severe 04/02/2015  . Hypernatremia 04/01/2015  . Acute encephalopathy 04/01/2015  . Cellulitis of leg, left 07/22/2012  . Alzheimer's disease 07/22/2012    Past Surgical History:  Procedure Laterality Date  . ABDOMINAL HYSTERECTOMY      OB History    No data available       Home Medications    Prior to Admission medications   Medication Sig Start Date End Date Taking? Authorizing Provider  acetaminophen (TYLENOL) 500 MG tablet Take 1,000 mg by mouth 2 (two) times daily as needed for moderate pain.    Yes Historical Provider, MD  cholecalciferol (VITAMIN D) 1000 units tablet Take 1,000 Units by mouth daily with breakfast.   Yes Historical Provider, MD  Emollient (CERAVE) CREA Apply 1  application topically 3 (three) times daily. Apply to bilateral lower extremities   Yes Historical Provider, MD  LORazepam (ATIVAN) 0.5 MG tablet Take 0.5 mg by mouth every 8 (eight) hours as needed for anxiety. agitation   Yes Historical Provider, MD  Nutritional Supplements (NUTRITIONAL DRINK) LIQD Take 1 Can by mouth 3 (three) times daily. *Mighty Shakes*   Yes Historical Provider, MD  oxyCODONE-acetaminophen (PERCOCET/ROXICET) 5-325 MG tablet Take 1 tablet by mouth every 4 (four) hours as needed for severe pain.   Yes Historical Provider, MD  senna (SENOKOT) 8.6 MG tablet Take 1 tablet by mouth 2 (two) times daily.    Yes Historical Provider, MD  Skin Protectants, Misc. (BAZA PROTECT EX) Apply 1 application topically 2 (two) times daily. Apply to buttocks after each incontinence episode   Yes Historical Provider, MD  triamcinolone (KENALOG) 0.025 % cream Apply 1 application topically 2 (two) times daily as needed (for itching feet). Apply to the left or right foot   Yes Historical Provider, MD    Family History Family History  Problem Relation Age of Onset  . Hypertension Other     Social History Social History  Substance Use Topics  . Smoking status: Never Smoker  . Smokeless tobacco: Never Used  . Alcohol use No     Allergies   Patient has no known allergies.   Review of Systems Review of Systems  Unable to perform ROS: Dementia     Physical Exam Updated Vital Signs BP 146/83   Pulse (!) 57   Temp 97.9 F (36.6 C) (Axillary)   Resp 16   SpO2 93%   Physical Exam  Constitutional: She appears well-developed and well-nourished.  HENT:  Head: Normocephalic and atraumatic.  Nose: Nose normal.  Neck: Normal range of motion. Neck supple.  Cardiovascular: Normal rate and normal heart sounds.   Pulmonary/Chest: Effort normal and breath sounds normal. No respiratory distress.  Abdominal: Soft. She exhibits no distension. There is no tenderness.  Musculoskeletal: Normal  range of motion.  Skin: Skin is warm.  Psychiatric: She has a normal mood and affect. Her behavior is normal.  Nursing note and vitals reviewed.    ED Treatments / Results  Labs (all labs ordered are listed, but only abnormal results are displayed) Labs Reviewed - No data to display  EKG  EKG Interpretation None       Radiology Ct Head Wo Contrast  Result Date: 12/12/2015 CLINICAL DATA:  Fall EXAM: CT HEAD WITHOUT CONTRAST CT CERVICAL SPINE WITHOUT CONTRAST TECHNIQUE: Multidetector CT imaging of the head and cervical spine was performed following the standard protocol without intravenous contrast. Multiplanar CT image reconstructions of the cervical spine were also generated. COMPARISON:  11/17/2015 FINDINGS: CT HEAD FINDINGS Brain: Severe diffuse cerebral atrophy and chronic small vessel disease changes. No acute intracranial abnormality. Specifically, no hemorrhage, hydrocephalus, mass lesion, acute infarction, or significant intracranial injury. Vascular: No hyperdense vessel or unexpected calcification. Skull: No acute calvarial abnormality Sinuses/Orbits: No acute findings Other: None CT CERVICAL SPINE FINDINGS Alignment: Slight anterolisthesis of C4 on C5 related to facet disease. Skull base and vertebrae: There is a fracture noted through the C7 spinous process, best seen on the sagittal reconstructed image. No vertebral body fracture. Soft tissues and spinal canal: No prevertebral fluid or swelling. No visible canal hematoma. Disc levels: Disc space narrowing from C4-5 thru C7-T1. Degenerative spurring at these levels. Upper chest: Emphysematous changes in the lung apices. No acute findings. Other: None IMPRESSION: Advanced cerebral volume loss and small vessel disease. No acute intracranial abnormality. Fracture through the spinous process at C7. No vertebral body involvement. Electronically Signed   By: Charlett NoseKevin  Dover M.D.   On: 12/12/2015 09:22   Ct Cervical Spine Wo  Contrast  Result Date: 12/12/2015 CLINICAL DATA:  Fall EXAM: CT HEAD WITHOUT CONTRAST CT CERVICAL SPINE WITHOUT CONTRAST TECHNIQUE: Multidetector CT imaging of the head and cervical spine was performed following the standard protocol without intravenous contrast. Multiplanar CT image reconstructions of the cervical spine were also generated. COMPARISON:  11/17/2015 FINDINGS: CT HEAD FINDINGS Brain: Severe diffuse cerebral atrophy and chronic small vessel disease changes. No acute intracranial abnormality. Specifically, no hemorrhage, hydrocephalus, mass lesion, acute infarction, or significant intracranial injury. Vascular: No hyperdense vessel or unexpected calcification. Skull: No acute calvarial abnormality Sinuses/Orbits: No acute findings Other: None CT CERVICAL SPINE FINDINGS Alignment: Slight anterolisthesis of C4 on C5 related to facet disease. Skull base and vertebrae: There is a fracture noted through the C7 spinous process, best seen on the sagittal reconstructed image. No vertebral body fracture. Soft tissues and spinal canal: No prevertebral fluid or swelling. No visible canal hematoma. Disc levels: Disc space narrowing from C4-5 thru C7-T1. Degenerative spurring at these levels. Upper chest: Emphysematous changes in the lung apices. No acute findings. Other: None IMPRESSION: Advanced cerebral volume loss and small vessel disease. No acute intracranial abnormality. Fracture through the spinous process at  C7. No vertebral body involvement. Electronically Signed   By: Charlett Nose M.D.   On: 12/12/2015 09:22    Procedures Procedures (including critical care time)  Medications Ordered in ED Medications - No data to display   Initial Impression / Assessment and Plan / ED Course  I have reviewed the triage vital signs and the nursing notes.  Pertinent labs & imaging results that were available during my care of the patient were reviewed by me and considered in my medical decision making (see  chart for details).  Clinical Course   History limited by patient stated severe dementia. Unable to answer History provided by nurses and triage note. On exam patient is calm, NAD, VSS, afebrile. Heart and lung sounds clear. No signs of acute lacerations, wounds, ecchymosis, erythema, bleeding, edema. No tenderness to palpation of head, spinous processes, pelvis, extremities. Abdomen soft/benign. CT cervical spine shows new fracture through the spinous process at C7. No vertebral body involvement. CT head shows no acute intracranial abnormality. C-spine neck brace was placed. Son-in-law presented at bedside and told the results upon discharge. Dr. Yetta Barre of neurosurgery was consulted and recommended no follow up. Patient's son in law given return precautions for patient. Patient will be sent back to her nursing home.   Final Clinical Impressions(s) / ED Diagnoses   Final diagnoses:  Closed fracture of seventh cervical vertebra without spinal cord injury, initial encounter [facility])  Fall, initial encounter    New Prescriptions Discharge Medication List as of 12/12/2015 11:19 AM       [address] [place], [place] 12/12/15 1640    Derwood Kaplan, MD 12/12/15 2056

## 2015-12-12 NOTE — ED Notes (Signed)
Pt has dementia is unable to answer questions regarding fall. Pt appears calm, no s/s of distress or discomfort is noted at this time.

## 2015-12-12 NOTE — ED Triage Notes (Signed)
Pt brought in by EMS from [facility]) with facility reporting an unwitness fall. No non verbal cues of discomfort has been reported.

## 2015-12-12 NOTE — Progress Notes (Signed)
CSW went to see if patient had any family at bedside. No family was present at the time. CSW staffed with Nurse Tech and no information is able to be obtained from patient. Nurse note states history of dementia.   Posey ReaLaVonia Magdelena Kinsella, LCSWA Clinical Social Worker [phone] 9:27 AM

## 2015-12-12 NOTE — Discharge Instructions (Signed)
Get help right away if: You have neck pain that gets worse. You develop difficulties swallowing or breathing. You develop swelling in your neck. You have any of the following problems in your arms, legs, or both: Numbness. Weakness. Burning pain. Movement problems. You are unable to control when you urinate or have a bowel movement (incontinence). You have problems with coordination or difficulty walking.

## 2015-12-12 NOTE — ED Notes (Signed)
Bed: WA04 Expected date:  Expected time:  Means of arrival:  Comments: 82 yo F  Found on floor at nursing home

## 2016-01-09 ENCOUNTER — Emergency Department (HOSPITAL_COMMUNITY)
Admission: EM | Admit: 2016-01-09 | Discharge: 2016-01-09 | Disposition: A | Discharge status: Home / Self Care | Attending: Emergency Medicine | Admitting: Emergency Medicine

## 2016-01-09 ENCOUNTER — Emergency Department (HOSPITAL_COMMUNITY)

## 2016-01-09 ENCOUNTER — Encounter (HOSPITAL_COMMUNITY): Payer: Self-pay

## 2016-01-09 DIAGNOSIS — S0990XA Unspecified injury of head, initial encounter: Secondary | ICD-10-CM | POA: Diagnosis not present

## 2016-01-09 DIAGNOSIS — W06XXXA Fall from bed, initial encounter: Secondary | ICD-10-CM | POA: Insufficient documentation

## 2016-01-09 DIAGNOSIS — G309 Alzheimer's disease, unspecified: Secondary | ICD-10-CM | POA: Insufficient documentation

## 2016-01-09 DIAGNOSIS — S161XXA Strain of muscle, fascia and tendon at neck level, initial encounter: Secondary | ICD-10-CM | POA: Diagnosis not present

## 2016-01-09 DIAGNOSIS — Y999 Unspecified external cause status: Secondary | ICD-10-CM | POA: Diagnosis not present

## 2016-01-09 DIAGNOSIS — Y929 Unspecified place or not applicable: Secondary | ICD-10-CM | POA: Diagnosis not present

## 2016-01-09 DIAGNOSIS — S199XXA Unspecified injury of neck, initial encounter: Secondary | ICD-10-CM | POA: Diagnosis present

## 2016-01-09 DIAGNOSIS — N183 Chronic kidney disease, stage 3 (moderate): Secondary | ICD-10-CM | POA: Diagnosis not present

## 2016-01-09 DIAGNOSIS — Y939 Activity, unspecified: Secondary | ICD-10-CM | POA: Diagnosis not present

## 2016-01-09 DIAGNOSIS — W19XXXA Unspecified fall, initial encounter: Secondary | ICD-10-CM

## 2016-01-09 HISTORY — DX: Dementia in other diseases classified elsewhere, unspecified severity, without behavioral disturbance, psychotic disturbance, mood disturbance, and anxiety: F02.80

## 2016-01-09 HISTORY — DX: Alzheimer's disease, unspecified: G30.9

## 2016-01-09 NOTE — ED Notes (Signed)
ED Provider at bedside. UPDATING FAMILY

## 2016-01-09 NOTE — ED Notes (Signed)
[facility] HERE FOR DISCHARGE.DNR YELLOW COPY GIVEN TO [facility]

## 2016-01-09 NOTE — ED Notes (Signed)
DAUGHTER CALLED BACK-UPDATED AD ON HER WAY

## 2016-01-09 NOTE — ED Notes (Signed)
[facility] called for transport.  

## 2016-01-09 NOTE — ED Notes (Signed)
DAUGHTER BRENDA [place]- [facility] [phone]) CALLED - NO RESPONSE

## 2016-01-09 NOTE — ED Notes (Signed)
Bed: [facility] Expected date:  Expected time:  Means of arrival:  Comments: EMS-fall 

## 2016-01-09 NOTE — ED Provider Notes (Signed)
Emergency Department Provider Note   I have reviewed the triage vital signs and the nursing notes.   HISTORY  Chief Complaint Fall and Dementia   HPI Shelley Fowler is a 83 y.o. female with PMH of alzheimer's disease, CKD, and frequent falls currently living at a nursing facility presents to the emergency department for evaluation of fall. By report the patient rolled out of bed was found the floor. Family at bedside were not present during the fall. Family states the patient seems to be at her mental status baseline. C-collar placed by EMS.   Level 5 caveat: The patient is unable to provide significant HPI or ROS detail 2/2 dementia.    Past Medical History:  Diagnosis Date  . Alzheimer disease   . CKD (chronic kidney disease)    Stage III  . Dementia   . Fall     Patient Active Problem List   Diagnosis Date Noted  . Chronic hypernatremia   . Acute renal failure superimposed on chronic kidney disease (HCC)   . Acute cystitis without hematuria   . Dementia with behavioral disturbance   . Hyponatremia 11/17/2015  . Acute hypernatremia 11/17/2015  . Palliative care encounter 04/03/2015  . DNR (do not resuscitate) 04/03/2015  . Pressure ulcer 04/02/2015  . Protein-calorie malnutrition, severe 04/02/2015  . Hypernatremia 04/01/2015  . Acute encephalopathy 04/01/2015  . Cellulitis of leg, left 07/22/2012  . Alzheimer's disease 07/22/2012    Past Surgical History:  Procedure Laterality Date  . ABDOMINAL HYSTERECTOMY      Current Outpatient Rx  . Order #: [id number] Class: Historical Med  . Order #: [id number] Class: Historical Med  . Order #: [id number] Class: Historical Med  . Order #: [id number] Class: Historical Med  . Order #: [id number] Class: Historical Med  . Order #: [id number] Class: Historical Med  . Order #: [id number] Class: Historical Med  . Order #: [id number] Class: Historical Med  . Order #: [id number] Class: Historical Med  . Order #: [id number] Class: Historical  Med  . Order #: [id number] Class: Historical Med  . Order #: [id number] Class: Historical Med    Allergies Patient has no known allergies.  Family History  Problem Relation Age of Onset  . Hypertension Other     Social History Social History  Substance Use Topics  . Smoking status: Never Smoker  . Smokeless tobacco: Never Used  . Alcohol use No    Review of Systems  Level 5 caveat: The patient is unable to provide significant HPI or ROS detail 2/2 dementia.   ____________________________________________   PHYSICAL EXAM:  VITAL SIGNS: ED Triage Vitals [01/09/16 1114]  Enc Vitals Group     BP (!) 144/131     Pulse Rate (!) 126     Resp 20     Temp 97.6 F (36.4 C)     Temp Source Oral     SpO2 96 %    Constitutional: Thin. Confused speech.  Eyes: Conjunctivae are normal. PERRL.  Head: Atraumatic. Nose: No congestion/rhinnorhea. Mouth/Throat: Mucous membranes are moist.  Oropharynx non-erythematous. Neck: No stridor.  c-collar in place.  Cardiovascular: Normal rate, regular rhythm. Good peripheral circulation. Grossly normal heart sounds.   Respiratory: Normal respiratory effort.  No retractions. Lungs CTAB. Gastrointestinal: Soft and nontender. No distention.  Musculoskeletal:  No gross deformities of extremities. Neurologic:  No gross focal neurologic deficits are appreciated.  Skin:  Skin is warm, dry and intact. No rash noted.  ____________________________________________  RADIOLOGY  Ct Head Wo Contrast  Result Date: 01/09/2016 CLINICAL DATA:  83 y/o female status post unwitnessed fall. Initial encounter. EXAM: CT HEAD WITHOUT CONTRAST CT CERVICAL SPINE WITHOUT CONTRAST TECHNIQUE: Multidetector CT imaging of the head and cervical spine was performed following the standard protocol without intravenous contrast. Multiplanar CT image reconstructions of the cervical spine were also generated. COMPARISON:  CT head and cervical spine 12/12/2015. FINDINGS: CT HEAD  FINDINGS Brain: Stable cerebral volume. Stable ventricle size and configuration. Stable gray-white matter differentiation throughout the brain. No midline shift, mass effect, or evidence of intracranial mass lesion. No acute intracranial hemorrhage identified. No cortically based acute infarct identified. Patchy hypodensity in the cerebral white matter and left thalamus. Vascular: Calcified atherosclerosis at the skull base. No suspicious intracranial vascular hyperdensity. Skull: Stable and negative. Sinuses/Orbits: Visualized paranasal sinuses and mastoids are stable and well pneumatized. Other: Leftward gaze deviation as before. Otherwise negative orbits soft tissues. No scalp hematoma identified. CT CERVICAL SPINE FINDINGS Alignment: Stable, with degenerative appearing spondylolisthesis at C4-C5. Stable cervicothoracic junction alignment. Bilateral posterior element alignment is within normal limits. Skull base and vertebrae: Visualized skull base is intact. No atlanto-occipital dissociation. Congenital incomplete segmentation of C5-C6. Associated adjacent segment degeneration with advanced disc and endplate disease. Advanced facet degeneration on the left at C4-C5 corresponding to the level of mild anterolisthesis. Other moderate to severe upper cervical facet degeneration. Mildly displaced C7 spinous process fracture re- demonstrated and not significantly changed since December. No new cervical spine fracture identified. Soft tissues and spinal canal: No prevertebral fluid or swelling. No visible canal hematoma. Negative noncontrast neck soft tissues. Disc levels: Stable cervical spine degeneration as described in the vertebral section above. Upper chest: Visible upper thoracic levels appear intact. Negative lung apices aside from emphysema. IMPRESSION: 1. Stable non contrast CT appearance of the brain. No acute head injury identified. 2. Stable cervical spine. C7 spinous process fracture is unchanged. No new  fracture or listhesis in the cervical spine. Electronically Signed   By: Odessa Fleming M.D.   On: 01/09/2016 14:14   Ct Cervical Spine Wo Contrast  Result Date: 01/09/2016 CLINICAL DATA:  83 y/o female status post unwitnessed fall. Initial encounter. EXAM: CT HEAD WITHOUT CONTRAST CT CERVICAL SPINE WITHOUT CONTRAST TECHNIQUE: Multidetector CT imaging of the head and cervical spine was performed following the standard protocol without intravenous contrast. Multiplanar CT image reconstructions of the cervical spine were also generated. COMPARISON:  CT head and cervical spine 12/12/2015. FINDINGS: CT HEAD FINDINGS Brain: Stable cerebral volume. Stable ventricle size and configuration. Stable gray-white matter differentiation throughout the brain. No midline shift, mass effect, or evidence of intracranial mass lesion. No acute intracranial hemorrhage identified. No cortically based acute infarct identified. Patchy hypodensity in the cerebral white matter and left thalamus. Vascular: Calcified atherosclerosis at the skull base. No suspicious intracranial vascular hyperdensity. Skull: Stable and negative. Sinuses/Orbits: Visualized paranasal sinuses and mastoids are stable and well pneumatized. Other: Leftward gaze deviation as before. Otherwise negative orbits soft tissues. No scalp hematoma identified. CT CERVICAL SPINE FINDINGS Alignment: Stable, with degenerative appearing spondylolisthesis at C4-C5. Stable cervicothoracic junction alignment. Bilateral posterior element alignment is within normal limits. Skull base and vertebrae: Visualized skull base is intact. No atlanto-occipital dissociation. Congenital incomplete segmentation of C5-C6. Associated adjacent segment degeneration with advanced disc and endplate disease. Advanced facet degeneration on the left at C4-C5 corresponding to the level of mild anterolisthesis. Other moderate to severe upper cervical facet degeneration. Mildly displaced C7 spinous process  fracture re- demonstrated and not significantly changed since December. No new cervical spine fracture  identified. Soft tissues and spinal canal: No prevertebral fluid or swelling. No visible canal hematoma. Negative noncontrast neck soft tissues. Disc levels: Stable cervical spine degeneration as described in the vertebral section above. Upper chest: Visible upper thoracic levels appear intact. Negative lung apices aside from emphysema. IMPRESSION: 1. Stable non contrast CT appearance of the brain. No acute head injury identified. 2. Stable cervical spine. C7 spinous process fracture is unchanged. No new fracture or listhesis in the cervical spine. Electronically Signed   By: Odessa Fleming M.D.   On: 01/09/2016 14:14    ____________________________________________   PROCEDURES  Procedure(s) performed:   Procedures  None ____________________________________________   INITIAL IMPRESSION / ASSESSMENT AND PLAN / ED COURSE  Pertinent labs & imaging results that were available during my care of the patient were reviewed by me and considered in my medical decision making (see chart for details).  Patient resents to the emergency department for evaluation of mechanical fall out of bed. Patient has baseline dementia which limits historical detail that unable to obtain. Family at bedside agree with plan for CT scan of the head and neck. The patient is DNR. Plan for discharge back to facility if negative w/u. Not anticoagulated.   CT negative. Plan for discharge back to SNF. Updated patient's family at bedside.   At this time, I do not feel there is any life-threatening condition present. I have reviewed and discussed all results (EKG, imaging, lab, urine as appropriate), exam findings with patient. I have reviewed nursing notes and appropriate previous records.  I feel the patient is safe to be discharged home without further emergent workup. Discussed usual and customary return precautions. Patient and  family (if present) verbalize understanding and are comfortable with this plan.  Patient will follow-up with their primary care provider. If they do not have a primary care provider, information for follow-up has been provided to them. All questions have been answered.  ____________________________________________  FINAL CLINICAL IMPRESSION(S) / ED DIAGNOSES  Final diagnoses:  Fall, initial encounter  Injury of head, initial encounter  Strain of neck muscle, initial encounter     MEDICATIONS GIVEN DURING THIS VISIT:  None  NEW OUTPATIENT MEDICATIONS STARTED DURING THIS VISIT:  None   Note:  This document was prepared using Dragon voice recognition software and may include unintentional dictation errors.  Alona Bene, MD Emergency Medicine   Maia Plan, MD 01/09/16 [phone]

## 2016-01-09 NOTE — ED Notes (Signed)
Patient transported to CT 

## 2016-01-09 NOTE — ED Triage Notes (Signed)
Per [facility]- [facility] HOSPICE. Unwitnessed fall. Incontinent per staff. No trauma noted. C-Collar for spinal support. HX of Dementia and falls. Bed alarm noted fall. Down time 15 minutes. No other complaints

## 2016-01-09 NOTE — Discharge Instructions (Signed)
You were seen in the Emergency Department (ED) today for a head injury.  Based on your evaluation, you may have sustained a concussion (or bruise) to your brain.  If you had a CT scan done, it did not show any evidence of serious injury or bleeding.   ° °Symptoms to expect from a concussion include nausea, mild to moderate headache, difficulty concentrating or sleeping, and mild lightheadedness.  These symptoms should improve over the next few days to weeks, but it may take many weeks before you feel back to normal.  Return to the emergency department or follow-up with your primary care doctor if your symptoms are not improving over this time. ° °Signs of a more serious head injury include vomiting, severe headache, excessive sleepiness or confusion, and weakness or numbness in your face, arms or legs.  Return immediately to the Emergency Department if you experience any of these more concerning symptoms.   ° °Rest, avoid strenuous physical or mental activity, and avoid activities that could potentially result in another head injury until all your symptoms from this head injury are completely resolved for at least 2-3 weeks.  You may take acetaminophen over the counter according to label instructions for mild headache or scalp soreness. ° °

## 2016-01-09 NOTE — ED Notes (Signed)
DNR YELLOW COPY WITH PT 

## 2016-01-09 NOTE — ED Notes (Signed)
PT IS A DNR. YELLOW COPY NOT WITH PT. [facility] [phone]). AWARE OF NEED FOR ORIGINAL COPY. ANGELA STATES "SHE WILL SEE WHAT SHE CAN DO". AWAITING RESPONSE.

## 2016-03-06 DEATH — deceased

## 2017-07-05 IMAGING — CT CT CERVICAL SPINE W/O CM
4 of 6 series · 15 of 33 positions shown, 16 images · non-contrast
Comparison: CT head and cervical spine 12/12/2015.

CLINICAL DATA: 89 y/o female status post unwitnessed fall. Initial
encounter.

EXAM:
CT HEAD WITHOUT CONTRAST
CT CERVICAL SPINE WITHOUT CONTRAST
TECHNIQUE: Multidetector CT imaging of the head and cervical spine was
performed following the standard protocol without intravenous
contrast. Multiplanar CT image reconstructions of the cervical spine
were also generated.

[Series 3: c-spine st · axial · 0.42mm/px · z∈[+1291,+1375]mm · 4 of 70 slices shown]
[im 14/70  bone]
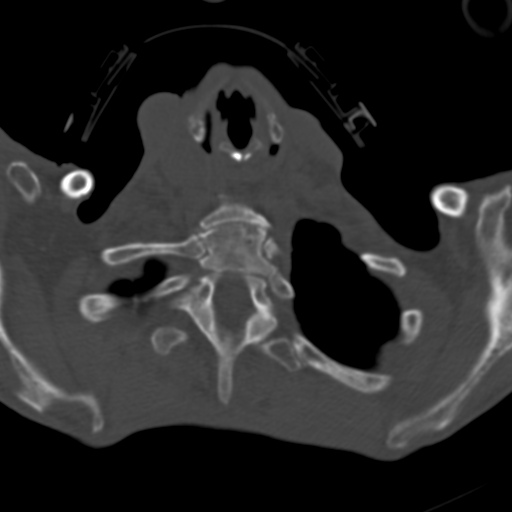
[im 28/70  bone]
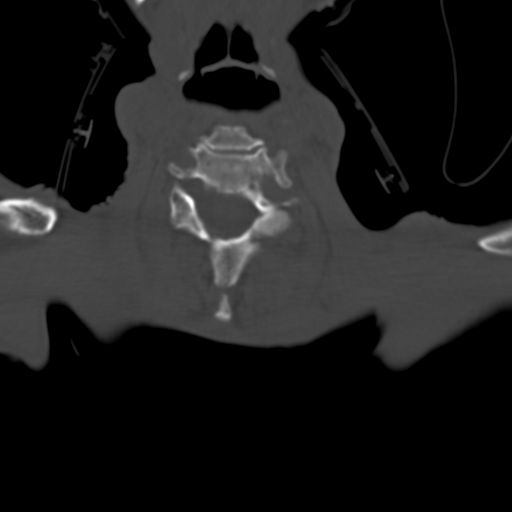
[im 42/70  bone]
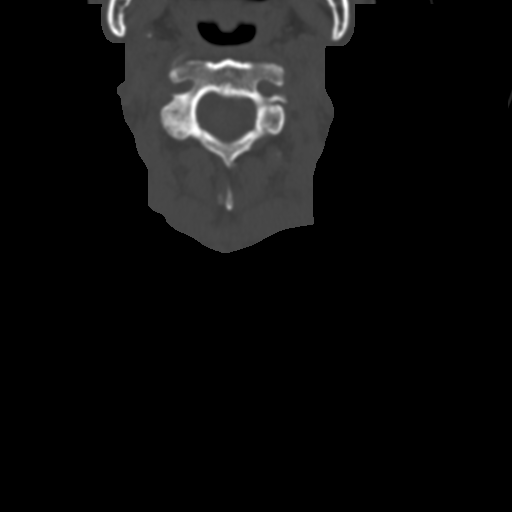
[im 56/70  bone]
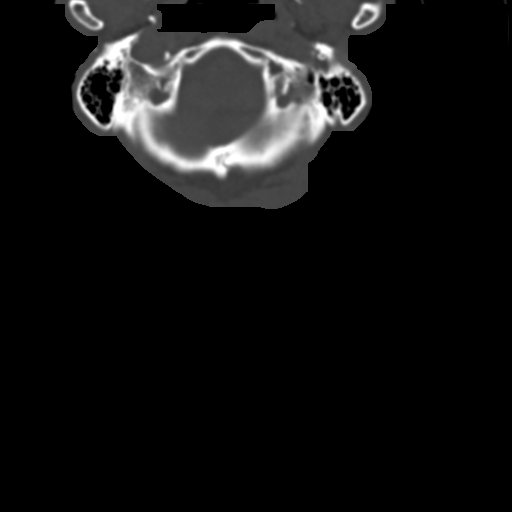

[Series 11: bone windows · axial · 0.43mm/px · z∈[+1412,+1490]mm · 3 of 52 slices shown, 4 images]
[im 13/52  soft-tissue]
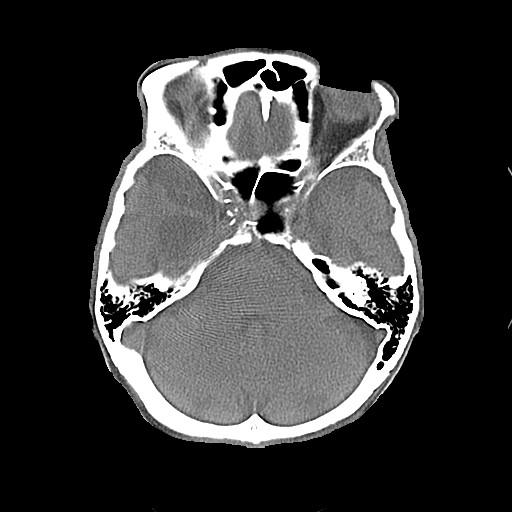
[im 13/52  bone]
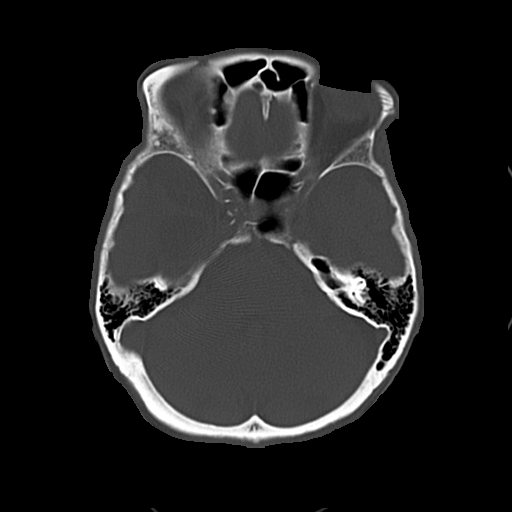
[im 26/52  bone]
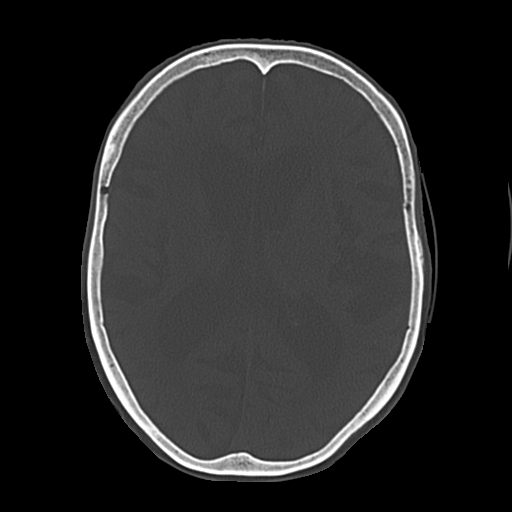
[im 39/52  bone]
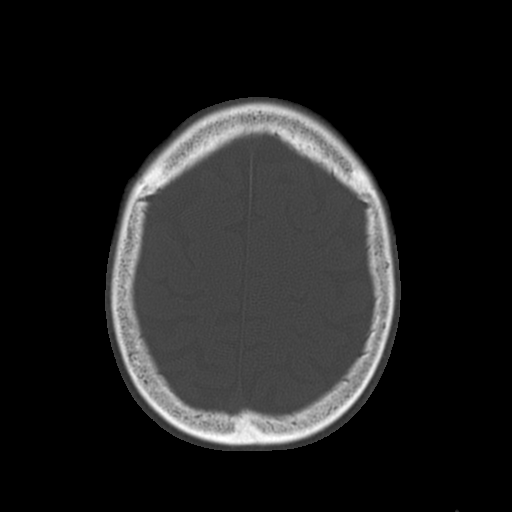

[Series 13: coronal · coronal · 0.31mm/px · 3 of 74 slices shown]
[im 15/74  bone]
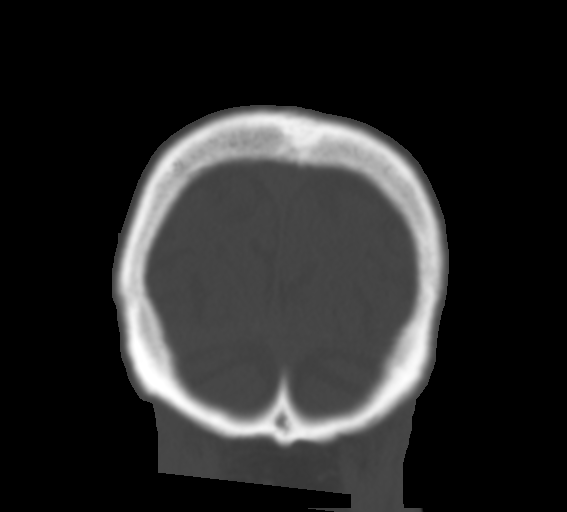
[im 30/74  bone]
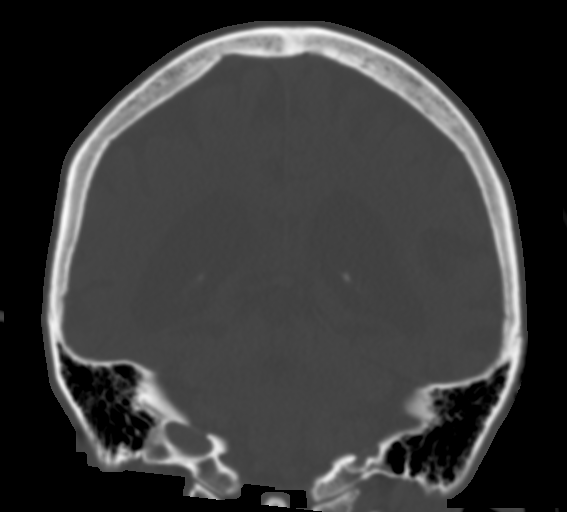
[im 44/74  bone]
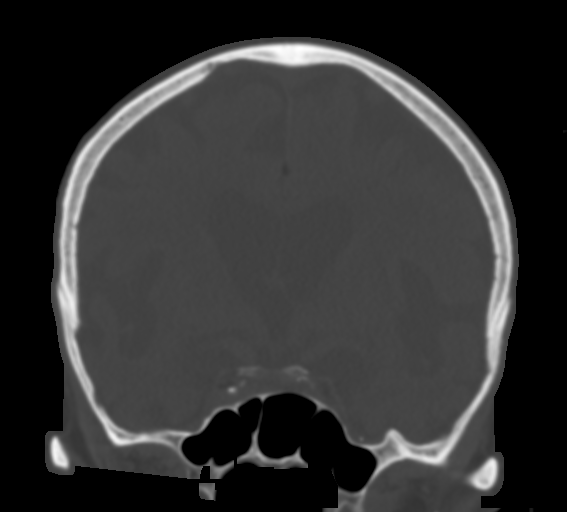

[Series 14: sagittal · sagittal · 0.32mm/px · 5 of 73 slices shown]
[im 13/73  bone]
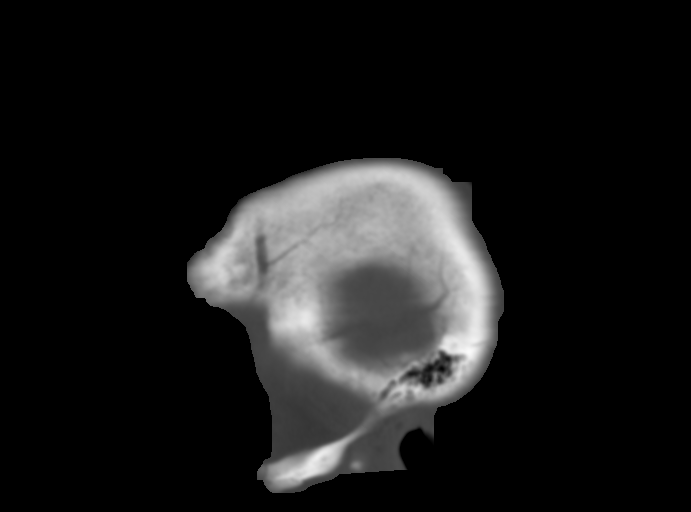
[im 25/73  bone]
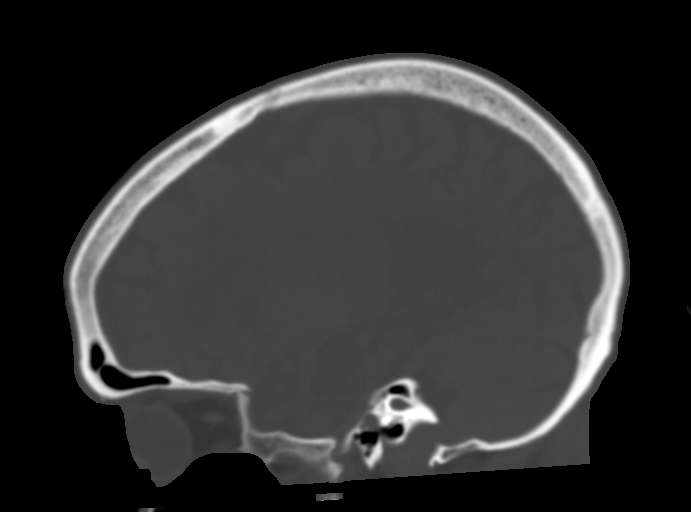
[im 37/73  bone]
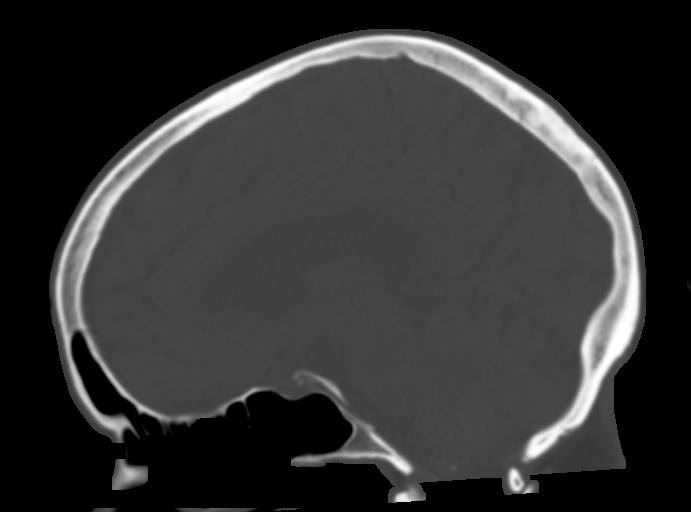
[im 49/73  bone]
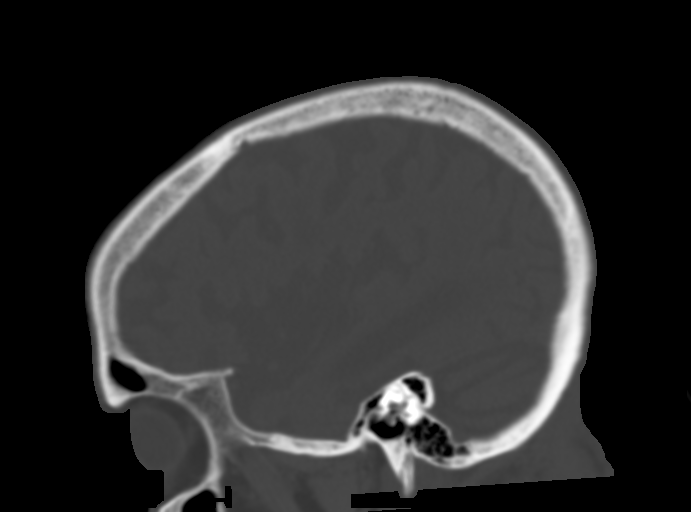
[im 61/73  bone]
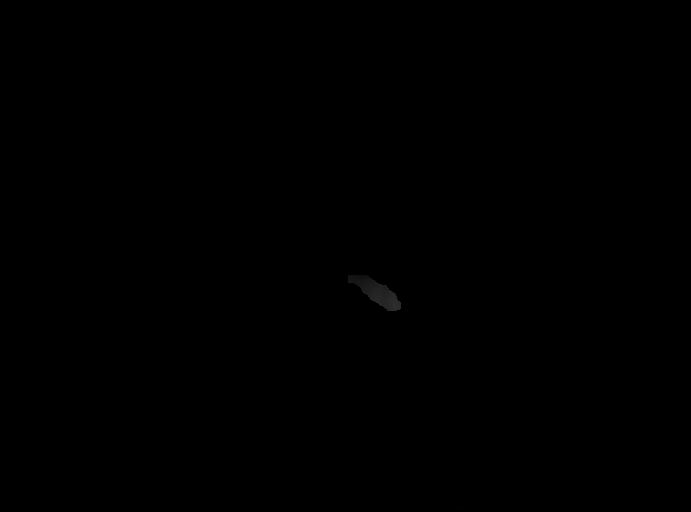

[15 of 33 positions shown; findings below may reference images not displayed]

FINDINGS: CT HEAD FINDINGS

Brain: Stable cerebral volume. Stable ventricle size and
configuration. Stable gray-white matter differentiation throughout
the brain. No midline shift, mass effect, or evidence of
intracranial mass lesion. No acute intracranial hemorrhage
identified. No cortically based acute infarct identified. Patchy
hypodensity in the cerebral white matter and left thalamus.

Vascular: Calcified atherosclerosis at the skull base. No suspicious
intracranial vascular hyperdensity.

Skull: Stable and negative.

Sinuses/Orbits: Visualized paranasal sinuses and mastoids are stable
and well pneumatized.

Other: Leftward gaze deviation as before. Otherwise negative orbits
soft tissues. No scalp hematoma identified.

CT CERVICAL SPINE FINDINGS

Alignment: Stable, with degenerative appearing spondylolisthesis at
C4-C5. Stable cervicothoracic junction alignment. Bilateral
posterior element alignment is within normal limits.

Skull base and vertebrae: Visualized skull base is intact. No
atlanto-occipital dissociation. Congenital incomplete segmentation
of C5-C6. Associated adjacent segment degeneration with advanced
disc and endplate disease. Advanced facet degeneration on the left
at C4-C5 corresponding to the level of mild anterolisthesis. Other
moderate to severe upper cervical facet degeneration. Mildly
displaced C7 spinous process fracture re- demonstrated and not
significantly changed since [REDACTED]. No new cervical spine fracture
identified.

Soft tissues and spinal canal: No prevertebral fluid or swelling. No
visible canal hematoma.

Negative noncontrast neck soft tissues.

Disc levels: Stable cervical spine degeneration as described in the
vertebral section above.

Upper chest: Visible upper thoracic levels appear intact. Negative
lung apices aside from emphysema.
IMPRESSION: 1. Stable non contrast CT appearance of the brain. No acute head
injury identified.
2. Stable cervical spine. C7 spinous process fracture is unchanged.
No new fracture or listhesis in the cervical spine.
